# Patient Record
Sex: Female | Born: 1940 | Race: White | Hispanic: No | Smoking: Never smoker
Health system: Southern US, Community
[De-identification: ages and names within clinical notes are randomized; demographics above are authoritative.]

## PROBLEM LIST (undated history)

## (undated) DIAGNOSIS — E785 Hyperlipidemia, unspecified: Secondary | ICD-10-CM

## (undated) DIAGNOSIS — I129 Hypertensive chronic kidney disease with stage 1 through stage 4 chronic kidney disease, or unspecified chronic kidney disease: Secondary | ICD-10-CM

## (undated) DIAGNOSIS — M179 Osteoarthritis of knee, unspecified: Secondary | ICD-10-CM

## (undated) DIAGNOSIS — E1165 Type 2 diabetes mellitus with hyperglycemia: Secondary | ICD-10-CM

## (undated) DIAGNOSIS — M1710 Unilateral primary osteoarthritis, unspecified knee: Secondary | ICD-10-CM

## (undated) DIAGNOSIS — E119 Type 2 diabetes mellitus without complications: Secondary | ICD-10-CM

## (undated) DIAGNOSIS — I10 Essential (primary) hypertension: Secondary | ICD-10-CM

## (undated) DIAGNOSIS — N182 Chronic kidney disease, stage 2 (mild): Secondary | ICD-10-CM

## (undated) DIAGNOSIS — E1129 Type 2 diabetes mellitus with other diabetic kidney complication: Secondary | ICD-10-CM

## (undated) DIAGNOSIS — E669 Obesity, unspecified: Secondary | ICD-10-CM

## (undated) DIAGNOSIS — M1990 Unspecified osteoarthritis, unspecified site: Secondary | ICD-10-CM

## (undated) HISTORY — DX: Type 2 diabetes mellitus with other diabetic kidney complication: E11.29

## (undated) HISTORY — DX: Essential (primary) hypertension: I10

## (undated) HISTORY — DX: Unspecified osteoarthritis, unspecified site: M19.90

## (undated) HISTORY — DX: Type 2 diabetes mellitus with hyperglycemia: E11.65

## (undated) HISTORY — DX: Osteoarthritis of knee, unspecified: M17.9

## (undated) HISTORY — DX: Hypertensive chronic kidney disease with stage 1 through stage 4 chronic kidney disease, or unspecified chronic kidney disease: I12.9

## (undated) HISTORY — DX: Type 2 diabetes mellitus without complications: E11.9

## (undated) HISTORY — DX: Obesity, unspecified: E66.9

## (undated) HISTORY — DX: Chronic kidney disease, stage 2 (mild): N18.2

## (undated) HISTORY — DX: Unilateral primary osteoarthritis, unspecified knee: M17.10

## (undated) HISTORY — DX: Hyperlipidemia, unspecified: E78.5

---

## 2008-02-20 ENCOUNTER — Ambulatory Visit (HOSPITAL_COMMUNITY): Admission: RE | Admit: 2008-02-20 | Discharge: 2008-02-20 | Payer: Self-pay | Admitting: Pulmonary Disease

## 2008-05-22 ENCOUNTER — Ambulatory Visit (HOSPITAL_COMMUNITY): Admission: RE | Admit: 2008-05-22 | Discharge: 2008-05-22 | Payer: Self-pay | Admitting: Pulmonary Disease

## 2008-05-22 ENCOUNTER — Encounter: Payer: Self-pay | Admitting: Orthopedic Surgery

## 2008-06-04 ENCOUNTER — Ambulatory Visit: Payer: Self-pay | Admitting: Orthopedic Surgery

## 2008-06-04 DIAGNOSIS — M1710 Unilateral primary osteoarthritis, unspecified knee: Secondary | ICD-10-CM

## 2008-06-04 DIAGNOSIS — IMO0002 Reserved for concepts with insufficient information to code with codable children: Secondary | ICD-10-CM | POA: Insufficient documentation

## 2008-08-25 ENCOUNTER — Ambulatory Visit (HOSPITAL_COMMUNITY): Admission: RE | Admit: 2008-08-25 | Discharge: 2008-08-25 | Payer: Self-pay | Admitting: Pulmonary Disease

## 2009-03-23 ENCOUNTER — Ambulatory Visit (HOSPITAL_COMMUNITY): Admission: RE | Admit: 2009-03-23 | Discharge: 2009-03-23 | Payer: Self-pay | Admitting: Pulmonary Disease

## 2009-05-11 ENCOUNTER — Ambulatory Visit (HOSPITAL_COMMUNITY): Admission: RE | Admit: 2009-05-11 | Discharge: 2009-05-11 | Payer: Self-pay | Admitting: Pulmonary Disease

## 2009-09-02 ENCOUNTER — Encounter: Payer: Self-pay | Admitting: Orthopedic Surgery

## 2010-08-17 IMAGING — CR DG CERVICAL SPINE COMPLETE 4+V
6 series · 6 of 6 positions shown · non-contrast
Comparison: None available

CLINICAL DATA: Bilateral hand numbness

CERVICAL SPINE - COMPLETE 4+ VIEW

[view not recorded (1 of 6)]
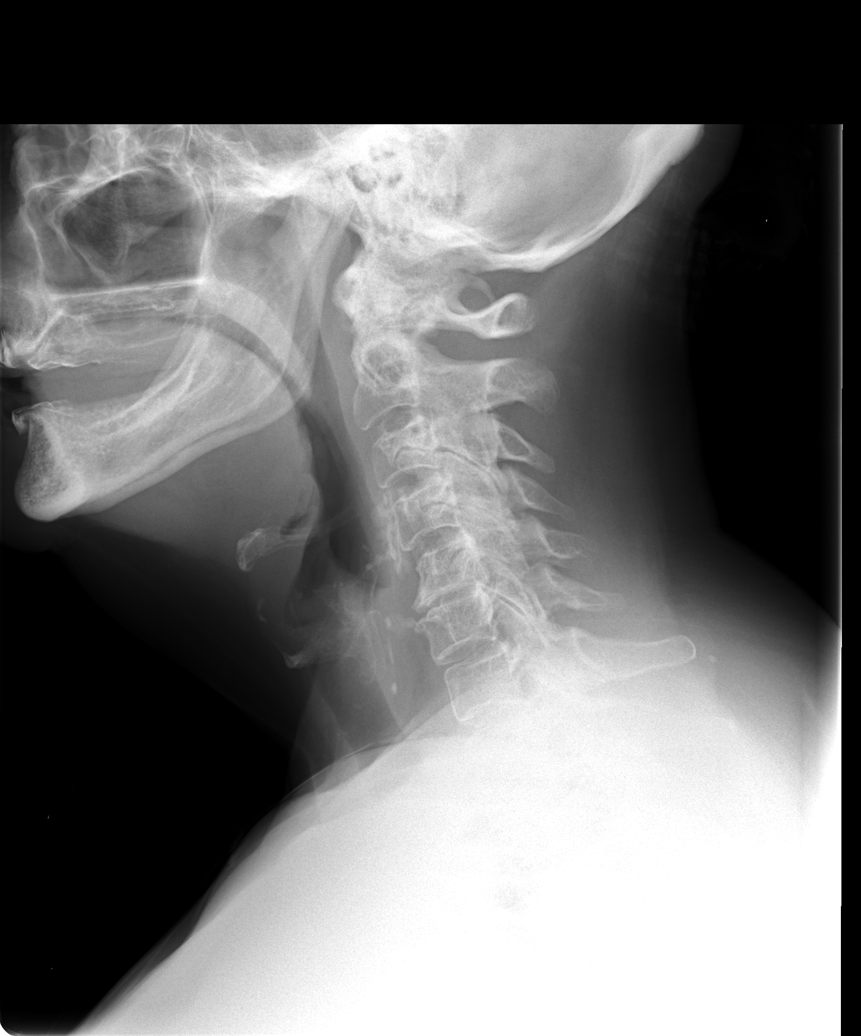

[view not recorded (2 of 6)]
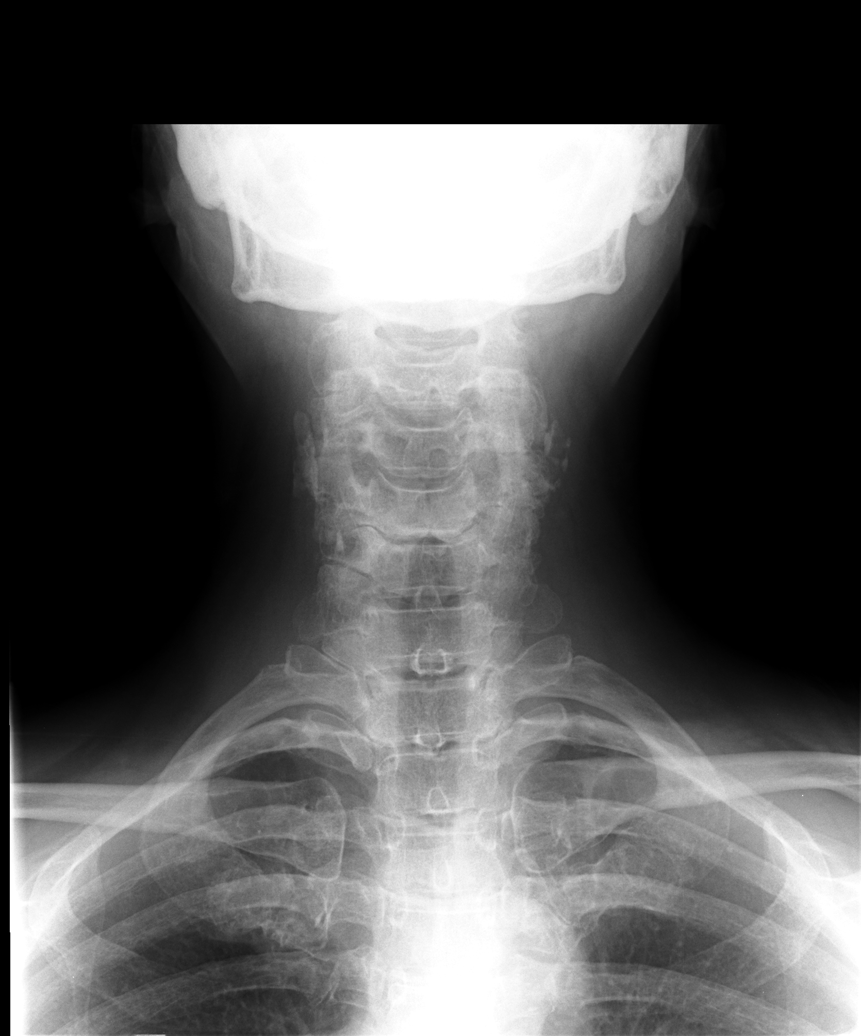

[view not recorded (3 of 6)]
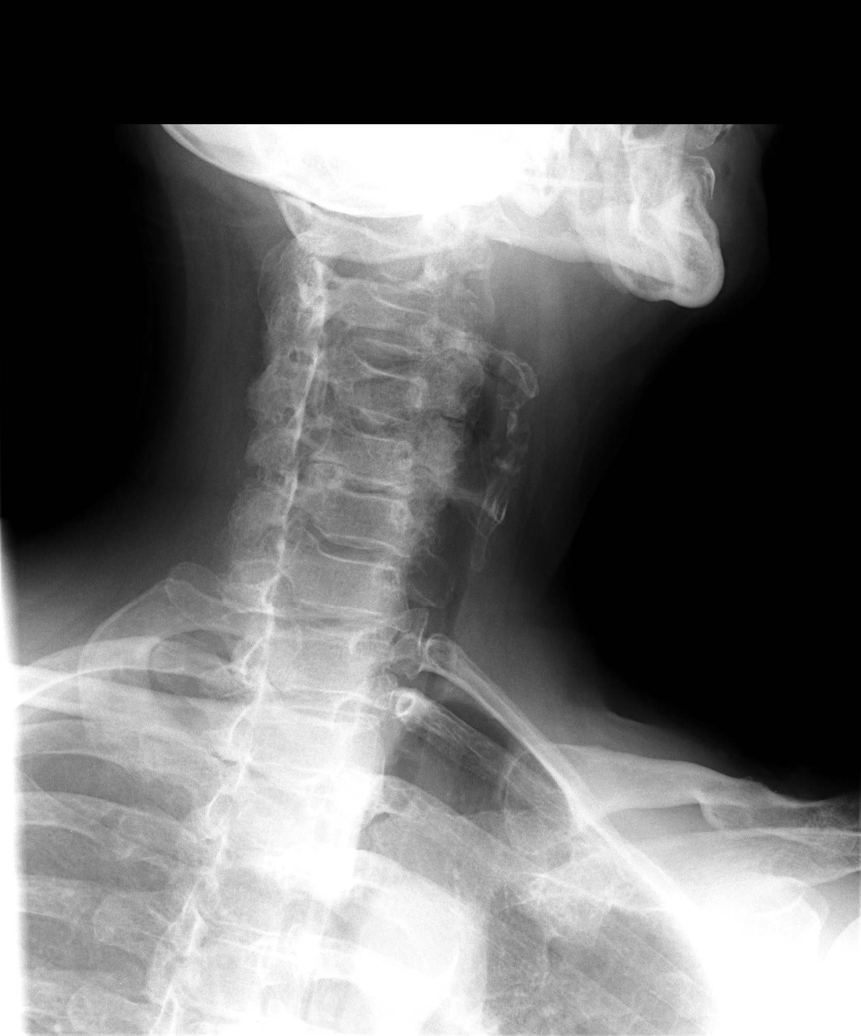

[view not recorded (4 of 6)]
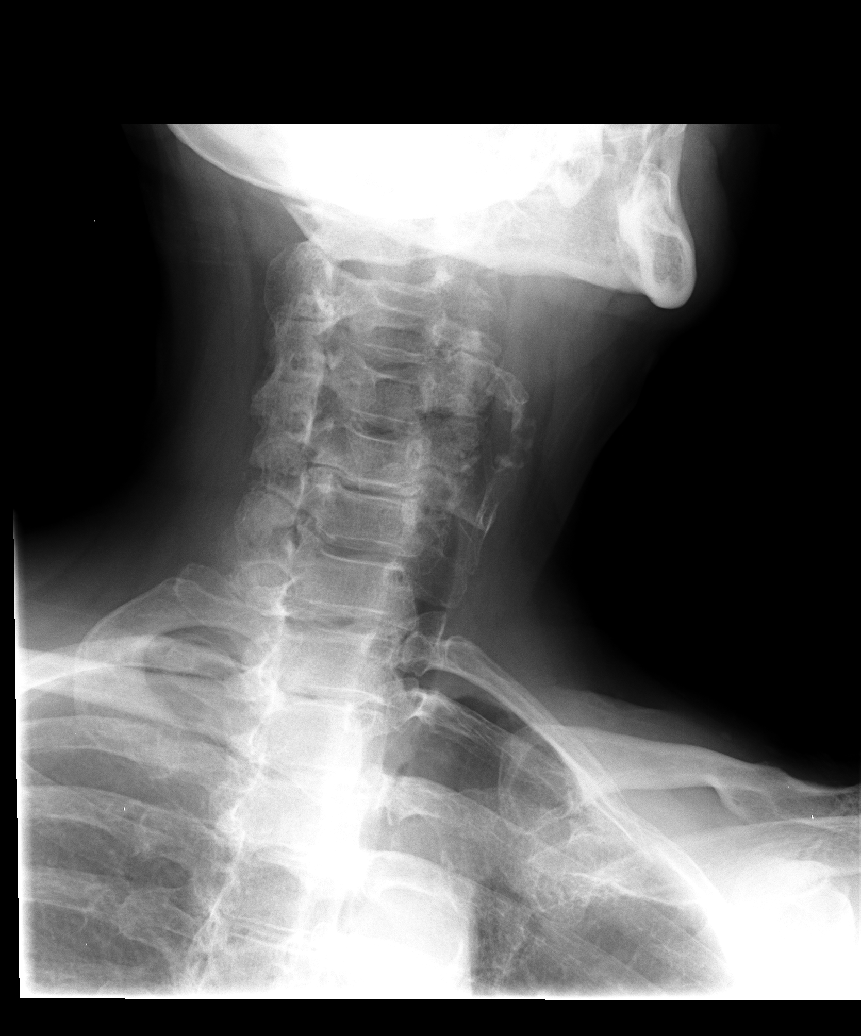

[view not recorded (5 of 6)]
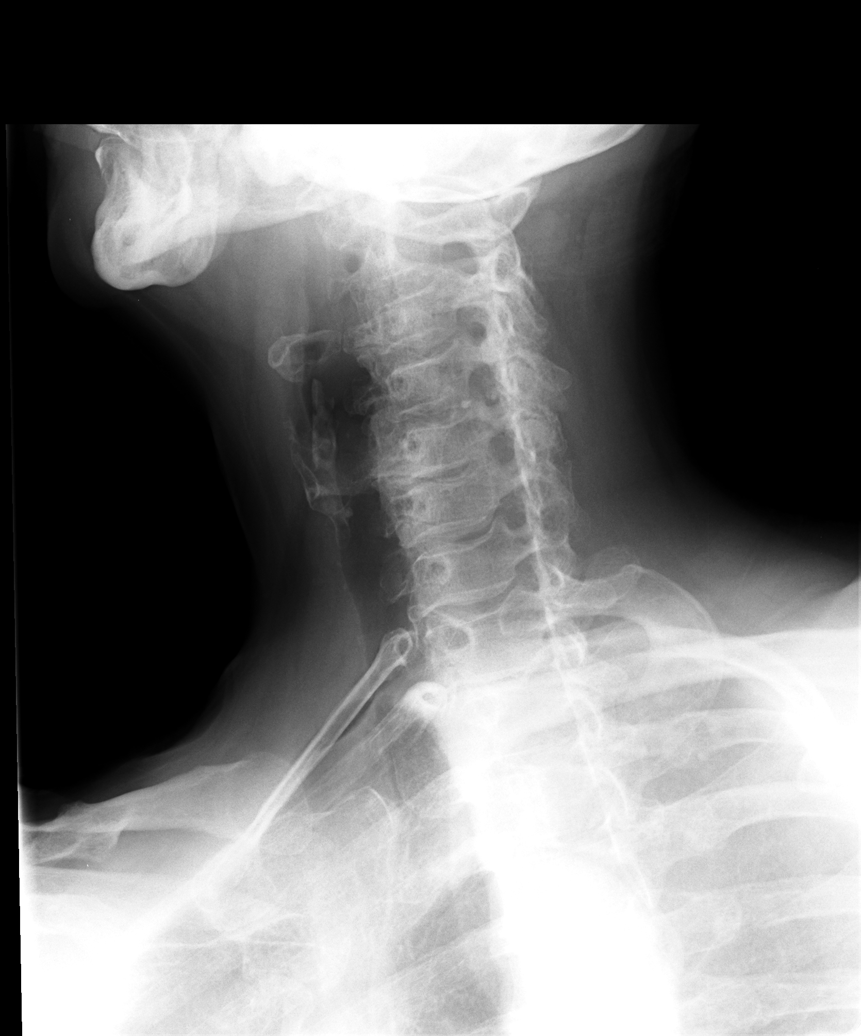

[view not recorded (6 of 6)]
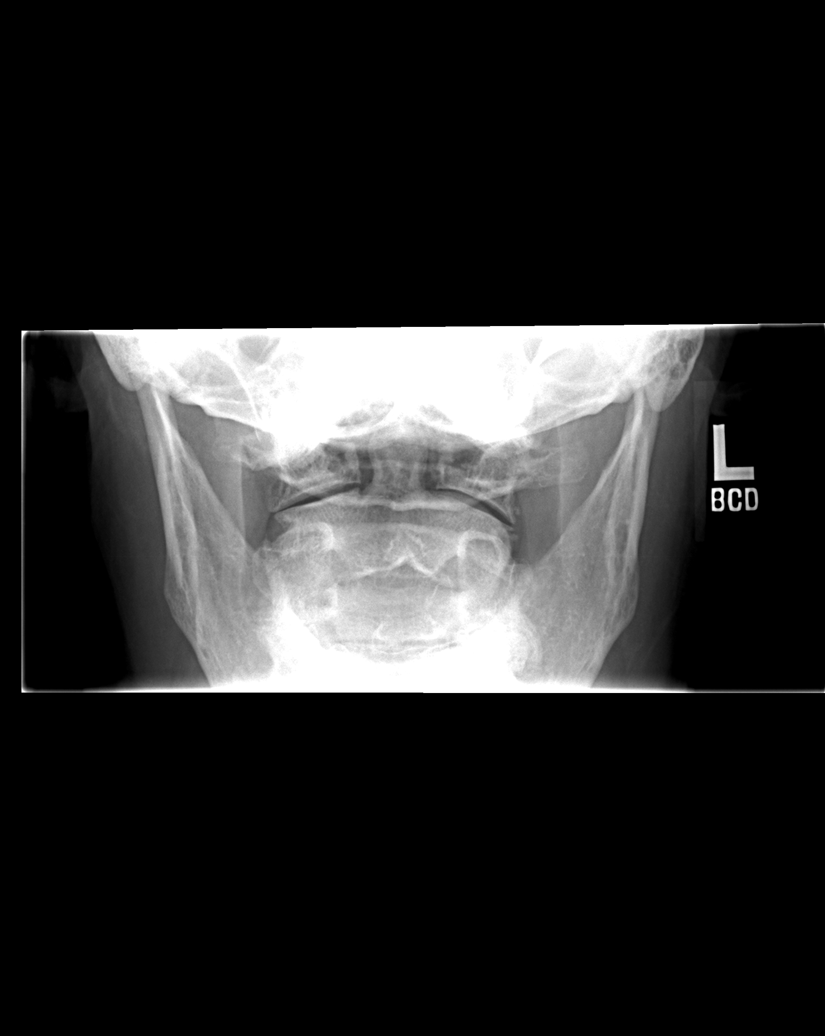

[6 of 6 positions shown; findings below may reference images not displayed]

FINDINGS: Negative for fracture.  There is straightening of the
normal lordosis of the cervical spine.  Narrowing of the C5-6
interspace with associated endplate spurring.  There is asymmetric
uncovertebral encroachment on the C5-6 neural foramen, right worse
than left.  There is bilateral facet degenerative hypertrophy C4-5
and C5-6.  Bilateral carotid bifurcation calcified plaque is noted.
The patient is edentulous.
IMPRESSION: 1. Negative for fracture or other acute bone injury.
2. Loss of the normal cervical spine lordosis, which may be
secondary to positioning, spasm, or soft tissue injury.
3.  Degenerative changes most marked C4-C6 as enumerated above.

## 2010-08-30 ENCOUNTER — Ambulatory Visit (HOSPITAL_COMMUNITY): Admission: RE | Admit: 2010-08-30 | Discharge: 2010-08-30 | Payer: Self-pay | Admitting: Pulmonary Disease

## 2010-11-02 NOTE — Letter (Signed)
Summary: Medical Record request [employer]  Medical Record request [employer]   Imported By: Cammie Sickle 10/29/2009 09:06:06  _____________________________________________________________________  External Attachment:    Type:   Image     Comment:   External Document

## 2011-05-03 IMAGING — US US EXTREM LOW VENOUS BILAT
1 series · 14 of 24 positions shown · non-contrast
Comparison: None

CLINICAL DATA: Bilateral leg and feet swelling

VENOUS DUPLEX ULTRASOUND OF BILATERAL LOWER EXTREMITIES
TECHNIQUE: Gray-scale sonography with graded compression, as well
as color Doppler and duplex ultrasound, were performed to evaluate
the deep venous system of both lower extremities from the level of
the common femoral vein through the popliteal and proximal calf
veins. Spectral Doppler was utilized to evaluate flow at rest and
with distal augmentation maneuvers.

[Series 1: us extrem low venous bilat · 14 of 80 slices shown]
[im 1/80]
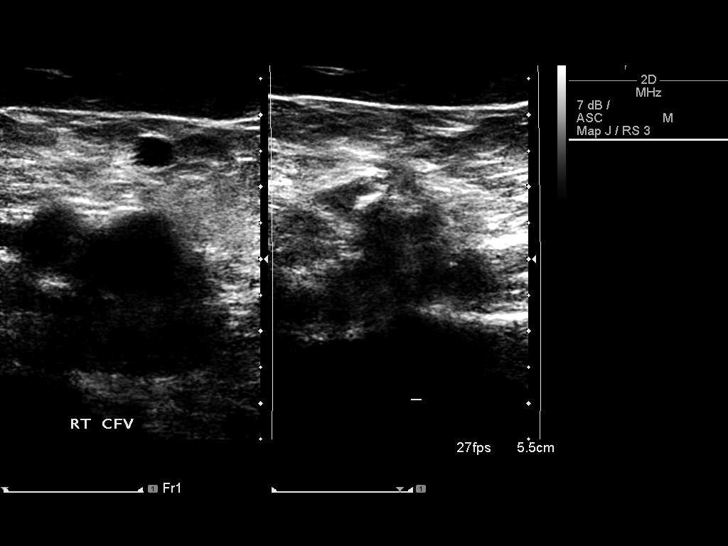
[im 7/80]
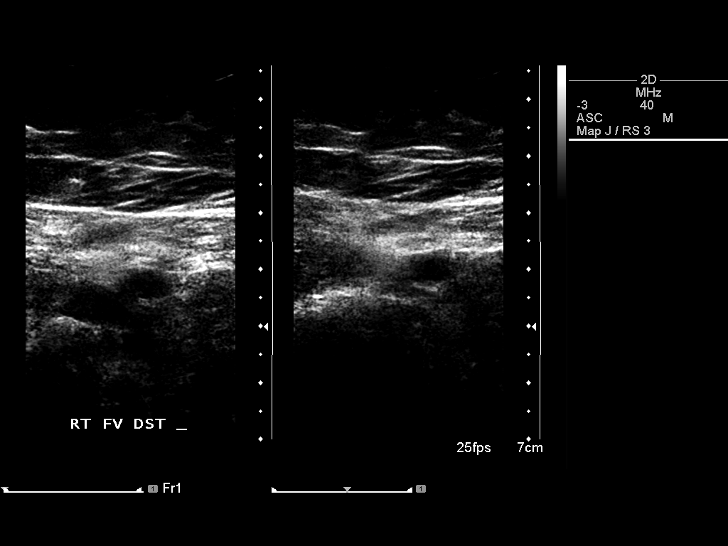
[im 14/80]
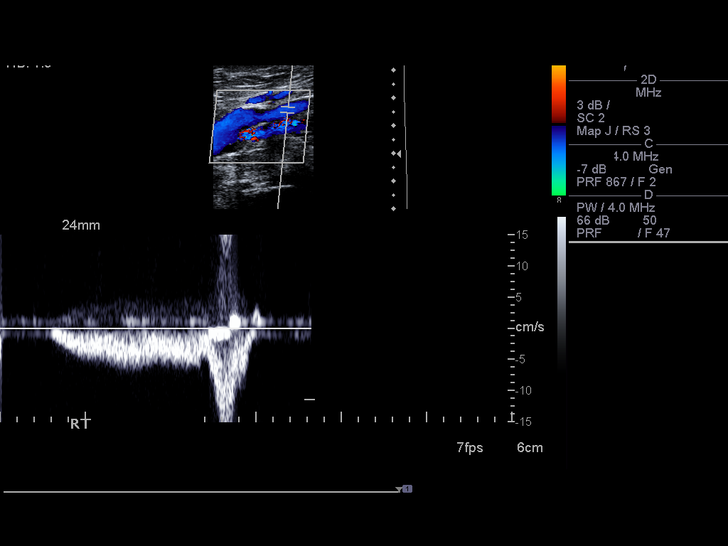
[im 21/80]
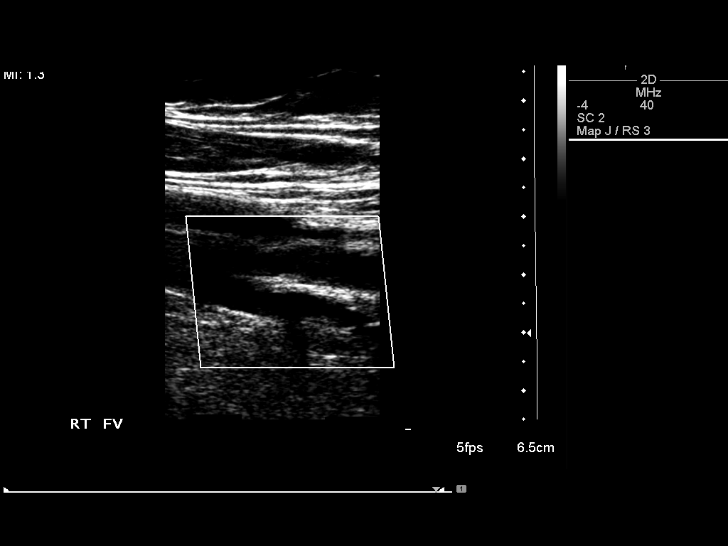
[im 25/80]
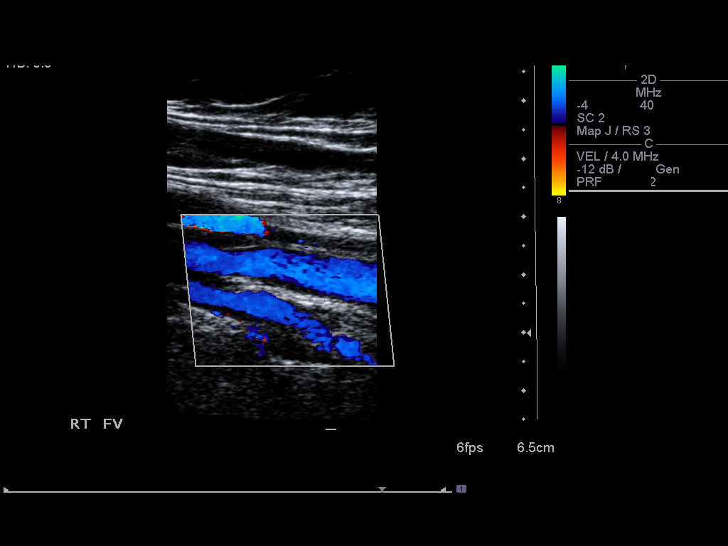
[im 31/80]
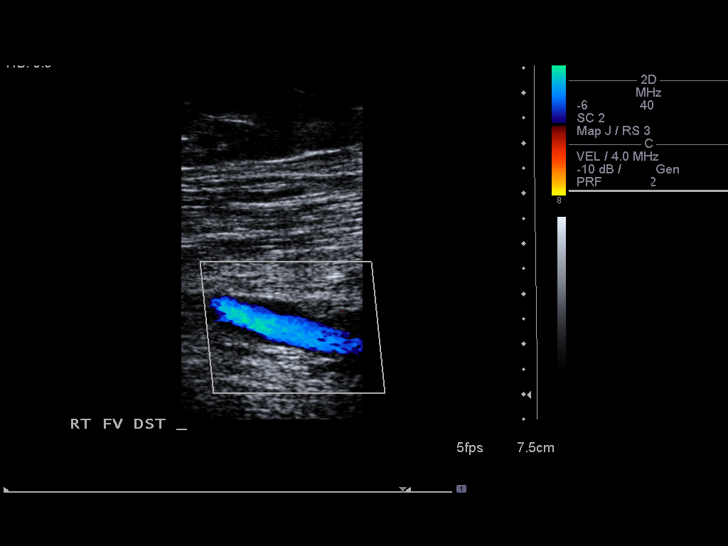
[im 38/80]
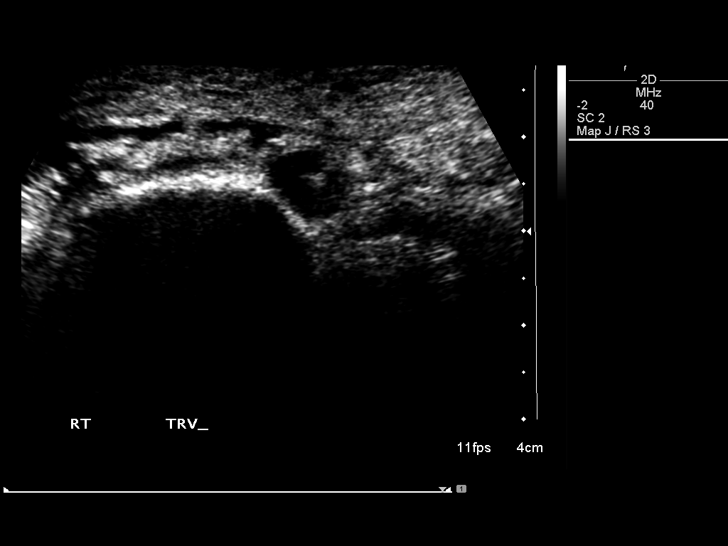
[im 42/80]
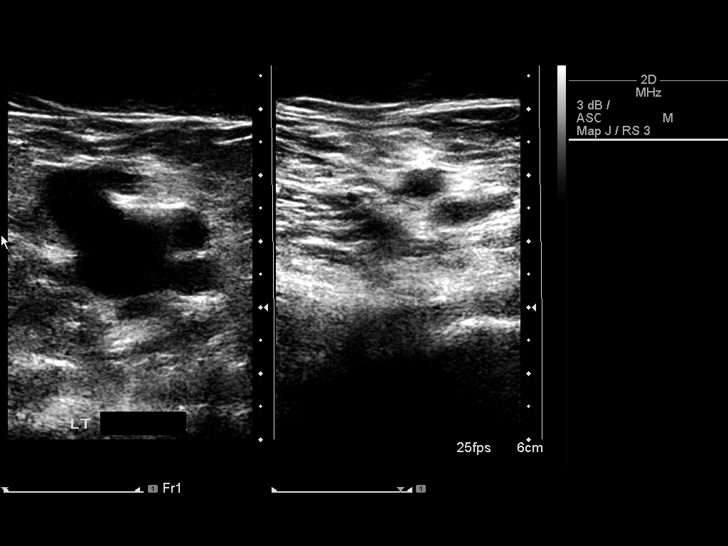
[im 49/80]
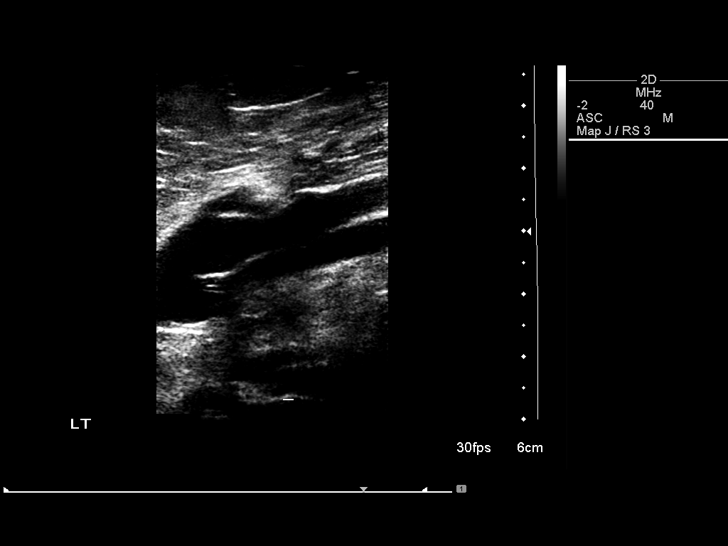
[im 55/80]
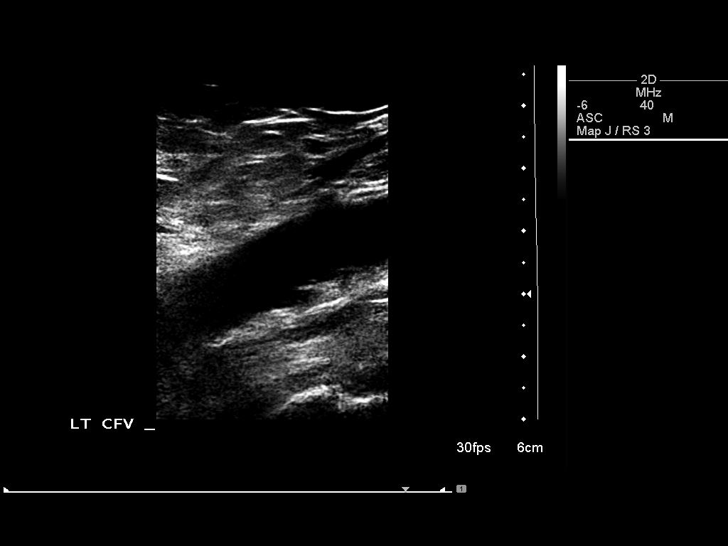
[im 62/80]
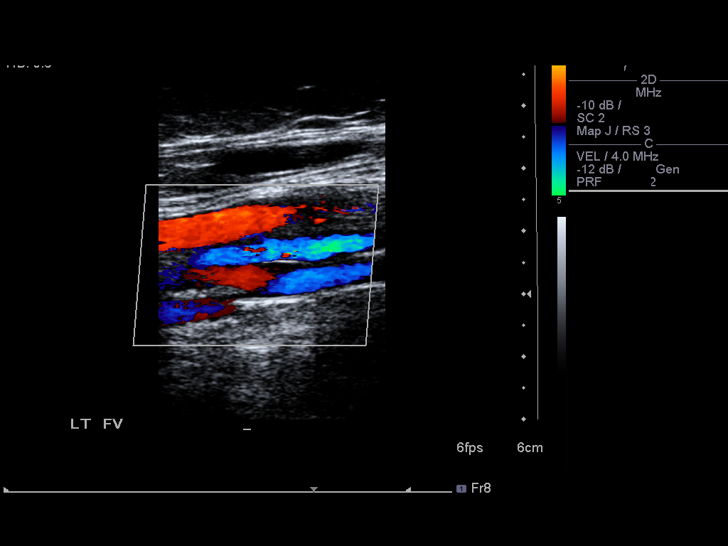
[im 66/80]
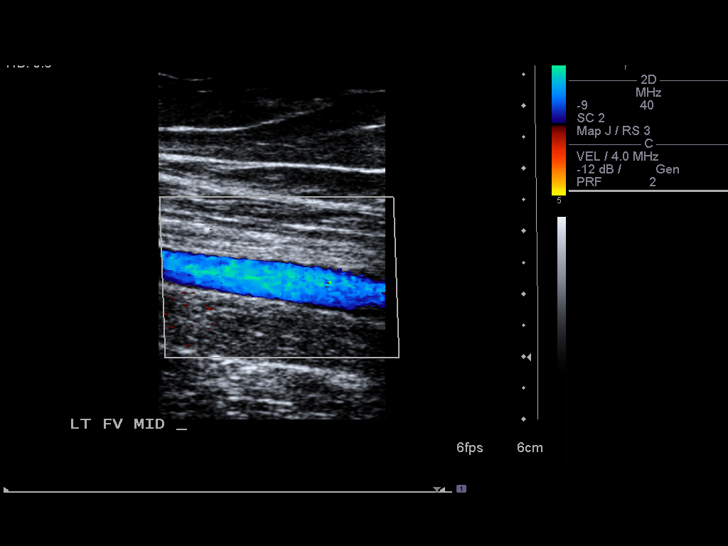
[im 73/80]
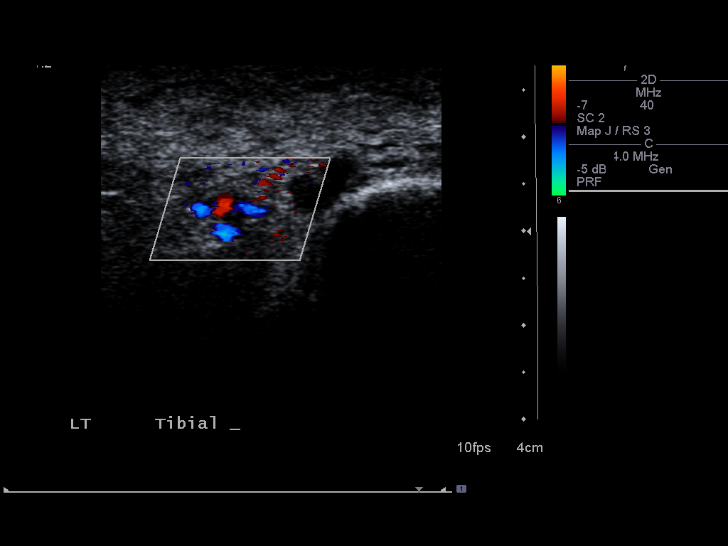
[im 80/80]
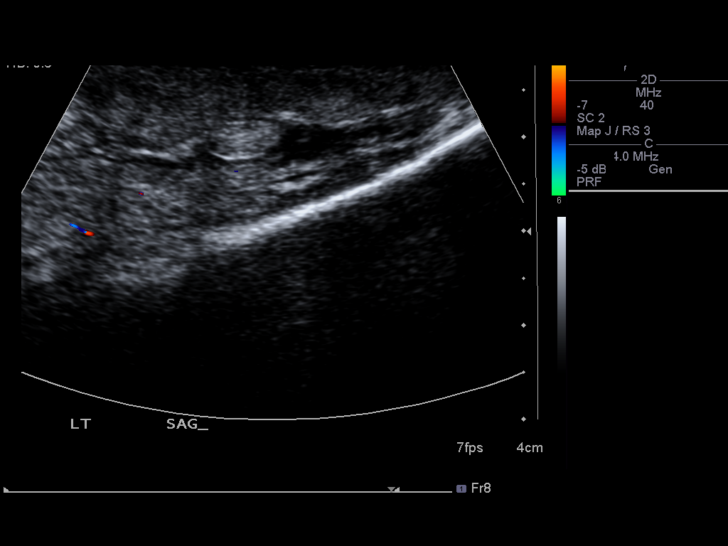

[14 of 24 positions shown; findings below may reference images not displayed]

FINDINGS: Deep venous systems appear patent and compressible from groin to
popliteal fossa bilaterally.
Spontaneous venous flow present with intact augmentation and
evidence of respiratory phasicity.
No intraluminal thrombus identified.
Scattered subcutaneous edema in the lower extremities noted.
Visualized portions of greater saphenous systems appear normal.
IMPRESSION: No evidence of deep venous thrombosis in the lower extremities.

## 2011-09-28 ENCOUNTER — Other Ambulatory Visit (HOSPITAL_COMMUNITY): Payer: Self-pay | Admitting: Pulmonary Disease

## 2011-09-28 DIAGNOSIS — Z139 Encounter for screening, unspecified: Secondary | ICD-10-CM

## 2011-10-06 ENCOUNTER — Ambulatory Visit (HOSPITAL_COMMUNITY)
Admission: RE | Admit: 2011-10-06 | Discharge: 2011-10-06 | Disposition: A | Discharge status: Home / Self Care | Payer: Medicare Other | Source: Ambulatory Visit | Attending: Pulmonary Disease | Admitting: Pulmonary Disease

## 2011-10-06 DIAGNOSIS — Z1231 Encounter for screening mammogram for malignant neoplasm of breast: Secondary | ICD-10-CM | POA: Insufficient documentation

## 2011-10-06 DIAGNOSIS — Z139 Encounter for screening, unspecified: Secondary | ICD-10-CM

## 2012-11-23 ENCOUNTER — Other Ambulatory Visit (HOSPITAL_COMMUNITY): Payer: Self-pay | Admitting: Pulmonary Disease

## 2012-11-23 DIAGNOSIS — Z139 Encounter for screening, unspecified: Secondary | ICD-10-CM

## 2012-11-26 ENCOUNTER — Ambulatory Visit (HOSPITAL_COMMUNITY)
Admission: RE | Admit: 2012-11-26 | Discharge: 2012-11-26 | Disposition: A | Discharge status: Home / Self Care | Payer: Medicare Other | Source: Ambulatory Visit | Attending: Pulmonary Disease | Admitting: Pulmonary Disease

## 2012-11-26 DIAGNOSIS — Z1231 Encounter for screening mammogram for malignant neoplasm of breast: Secondary | ICD-10-CM | POA: Insufficient documentation

## 2012-11-26 DIAGNOSIS — Z139 Encounter for screening, unspecified: Secondary | ICD-10-CM

## 2014-01-01 ENCOUNTER — Other Ambulatory Visit (HOSPITAL_COMMUNITY): Payer: Self-pay | Admitting: Pulmonary Disease

## 2014-01-01 DIAGNOSIS — Z139 Encounter for screening, unspecified: Secondary | ICD-10-CM

## 2014-01-02 ENCOUNTER — Ambulatory Visit (HOSPITAL_COMMUNITY)
Admission: RE | Admit: 2014-01-02 | Discharge: 2014-01-02 | Disposition: A | Discharge status: Home / Self Care | Payer: Medicare Other | Source: Ambulatory Visit | Attending: Pulmonary Disease | Admitting: Pulmonary Disease

## 2014-01-02 DIAGNOSIS — Z139 Encounter for screening, unspecified: Secondary | ICD-10-CM

## 2014-01-02 DIAGNOSIS — Z1231 Encounter for screening mammogram for malignant neoplasm of breast: Secondary | ICD-10-CM | POA: Insufficient documentation

## 2014-01-03 ENCOUNTER — Other Ambulatory Visit: Payer: Self-pay | Admitting: Pulmonary Disease

## 2014-01-03 DIAGNOSIS — R928 Other abnormal and inconclusive findings on diagnostic imaging of breast: Secondary | ICD-10-CM

## 2014-01-22 ENCOUNTER — Ambulatory Visit (HOSPITAL_COMMUNITY)
Admission: RE | Admit: 2014-01-22 | Discharge: 2014-01-22 | Disposition: A | Discharge status: Home / Self Care | Payer: Medicare Other | Source: Ambulatory Visit | Attending: Pulmonary Disease | Admitting: Pulmonary Disease

## 2014-01-22 DIAGNOSIS — R928 Other abnormal and inconclusive findings on diagnostic imaging of breast: Secondary | ICD-10-CM

## 2015-02-05 ENCOUNTER — Other Ambulatory Visit (HOSPITAL_COMMUNITY): Payer: Self-pay | Admitting: Pulmonary Disease

## 2015-02-05 DIAGNOSIS — Z1231 Encounter for screening mammogram for malignant neoplasm of breast: Secondary | ICD-10-CM

## 2015-02-24 ENCOUNTER — Ambulatory Visit (HOSPITAL_COMMUNITY)
Admission: RE | Admit: 2015-02-24 | Discharge: 2015-02-24 | Disposition: A | Discharge status: Home / Self Care | Payer: Medicare Other | Source: Ambulatory Visit | Attending: Pulmonary Disease | Admitting: Pulmonary Disease

## 2015-02-24 ENCOUNTER — Other Ambulatory Visit (HOSPITAL_COMMUNITY): Payer: Self-pay | Admitting: Pulmonary Disease

## 2015-02-24 DIAGNOSIS — Z1231 Encounter for screening mammogram for malignant neoplasm of breast: Secondary | ICD-10-CM | POA: Diagnosis present

## 2015-02-24 LAB — HM MAMMOGRAPHY

## 2015-02-26 ENCOUNTER — Ambulatory Visit (HOSPITAL_COMMUNITY): Payer: Self-pay

## 2015-02-26 ENCOUNTER — Other Ambulatory Visit: Payer: Self-pay | Admitting: Pulmonary Disease

## 2015-02-26 DIAGNOSIS — R928 Other abnormal and inconclusive findings on diagnostic imaging of breast: Secondary | ICD-10-CM

## 2015-03-17 ENCOUNTER — Ambulatory Visit (HOSPITAL_COMMUNITY)
Admission: RE | Admit: 2015-03-17 | Discharge: 2015-03-17 | Disposition: A | Discharge status: Home / Self Care | Payer: Medicare Other | Source: Ambulatory Visit | Attending: Pulmonary Disease | Admitting: Pulmonary Disease

## 2015-03-17 ENCOUNTER — Other Ambulatory Visit: Payer: Self-pay | Admitting: Pulmonary Disease

## 2015-03-17 DIAGNOSIS — R928 Other abnormal and inconclusive findings on diagnostic imaging of breast: Secondary | ICD-10-CM

## 2016-05-09 DIAGNOSIS — E119 Type 2 diabetes mellitus without complications: Secondary | ICD-10-CM | POA: Diagnosis not present

## 2016-05-09 DIAGNOSIS — M1990 Unspecified osteoarthritis, unspecified site: Secondary | ICD-10-CM | POA: Diagnosis not present

## 2016-05-09 DIAGNOSIS — I10 Essential (primary) hypertension: Secondary | ICD-10-CM | POA: Diagnosis not present

## 2016-05-09 DIAGNOSIS — E785 Hyperlipidemia, unspecified: Secondary | ICD-10-CM | POA: Diagnosis not present

## 2016-05-12 DIAGNOSIS — M25561 Pain in right knee: Secondary | ICD-10-CM | POA: Diagnosis not present

## 2016-05-12 DIAGNOSIS — E1129 Type 2 diabetes mellitus with other diabetic kidney complication: Secondary | ICD-10-CM | POA: Diagnosis not present

## 2016-05-12 DIAGNOSIS — I10 Essential (primary) hypertension: Secondary | ICD-10-CM | POA: Diagnosis not present

## 2016-05-12 DIAGNOSIS — N182 Chronic kidney disease, stage 2 (mild): Secondary | ICD-10-CM | POA: Diagnosis not present

## 2016-05-12 DIAGNOSIS — M25562 Pain in left knee: Secondary | ICD-10-CM | POA: Diagnosis not present

## 2016-05-12 DIAGNOSIS — M1990 Unspecified osteoarthritis, unspecified site: Secondary | ICD-10-CM | POA: Diagnosis not present

## 2016-08-17 DIAGNOSIS — Z23 Encounter for immunization: Secondary | ICD-10-CM | POA: Diagnosis not present

## 2016-09-08 DIAGNOSIS — E1165 Type 2 diabetes mellitus with hyperglycemia: Secondary | ICD-10-CM | POA: Diagnosis not present

## 2016-09-12 DIAGNOSIS — E1129 Type 2 diabetes mellitus with other diabetic kidney complication: Secondary | ICD-10-CM | POA: Diagnosis not present

## 2016-09-12 DIAGNOSIS — I10 Essential (primary) hypertension: Secondary | ICD-10-CM | POA: Diagnosis not present

## 2016-09-12 DIAGNOSIS — N182 Chronic kidney disease, stage 2 (mild): Secondary | ICD-10-CM | POA: Diagnosis not present

## 2016-12-07 DIAGNOSIS — E1165 Type 2 diabetes mellitus with hyperglycemia: Secondary | ICD-10-CM | POA: Diagnosis not present

## 2016-12-08 DIAGNOSIS — E785 Hyperlipidemia, unspecified: Secondary | ICD-10-CM | POA: Diagnosis not present

## 2016-12-08 DIAGNOSIS — E669 Obesity, unspecified: Secondary | ICD-10-CM | POA: Diagnosis not present

## 2016-12-08 DIAGNOSIS — E1165 Type 2 diabetes mellitus with hyperglycemia: Secondary | ICD-10-CM | POA: Diagnosis not present

## 2016-12-08 DIAGNOSIS — E1129 Type 2 diabetes mellitus with other diabetic kidney complication: Secondary | ICD-10-CM | POA: Diagnosis not present

## 2016-12-12 DIAGNOSIS — I10 Essential (primary) hypertension: Secondary | ICD-10-CM | POA: Diagnosis not present

## 2016-12-12 DIAGNOSIS — E1165 Type 2 diabetes mellitus with hyperglycemia: Secondary | ICD-10-CM | POA: Diagnosis not present

## 2016-12-12 DIAGNOSIS — E1129 Type 2 diabetes mellitus with other diabetic kidney complication: Secondary | ICD-10-CM | POA: Diagnosis not present

## 2016-12-12 DIAGNOSIS — E785 Hyperlipidemia, unspecified: Secondary | ICD-10-CM | POA: Diagnosis not present

## 2017-03-06 DIAGNOSIS — E1165 Type 2 diabetes mellitus with hyperglycemia: Secondary | ICD-10-CM | POA: Diagnosis not present

## 2017-03-08 IMAGING — US US BREAST LTD UNI LEFT INC AXILLA
1 series · 4 of 4 positions shown · non-contrast
Comparison: Prior exams

CLINICAL DATA: Call back from screening for a possible left breast
mass

EXAM:
DIGITAL DIAGNOSTIC LEFT MAMMOGRAM WITH 3D TOMOSYNTHESIS
ULTRASOUND LEFT BREAST

[Series 1: us breast ltd uni left inc axilla · 0.07mm/px · 4 of 4 slices shown]
[im 1/4]
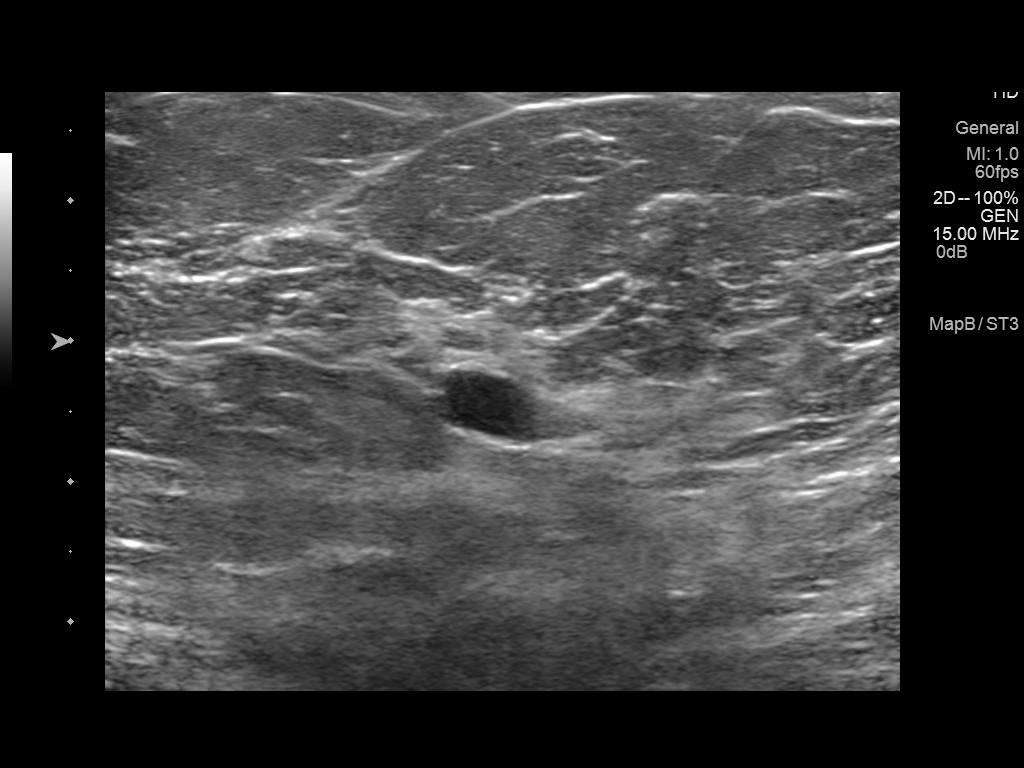
[im 2/4]
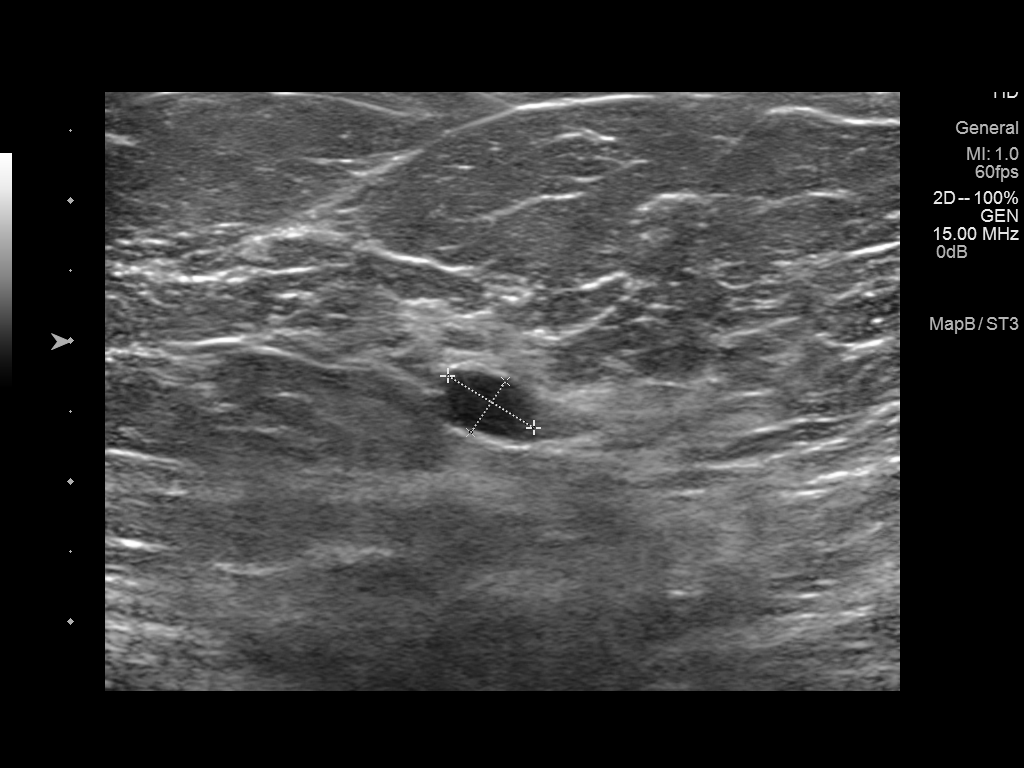
[im 3/4]
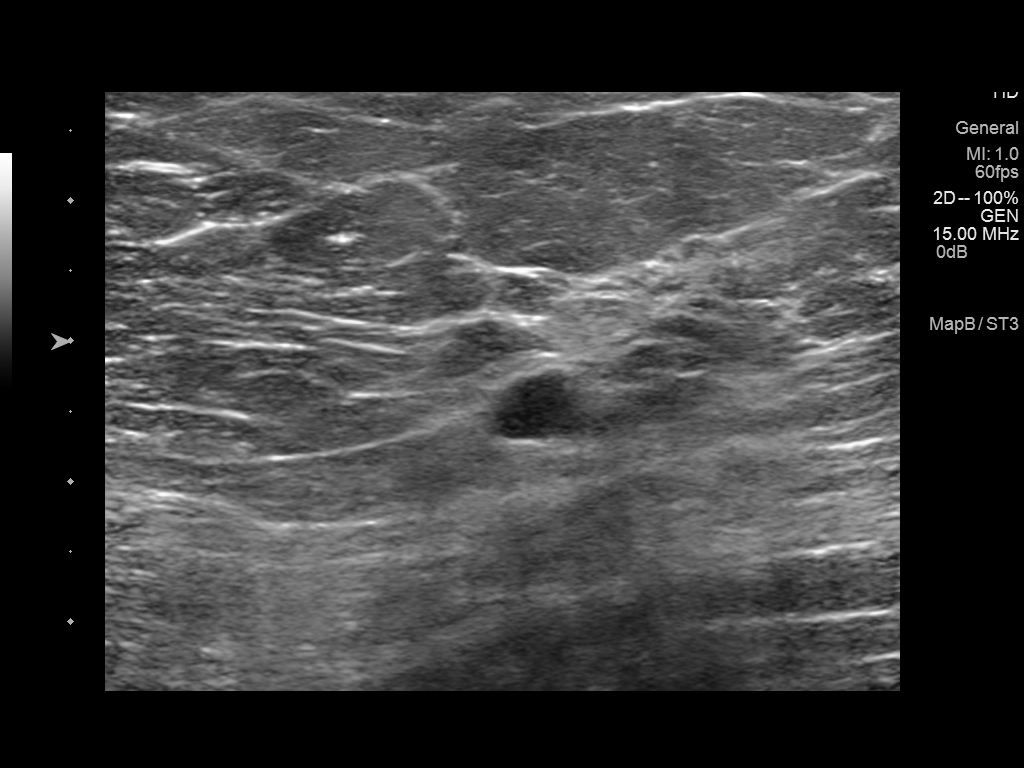
[im 4/4]
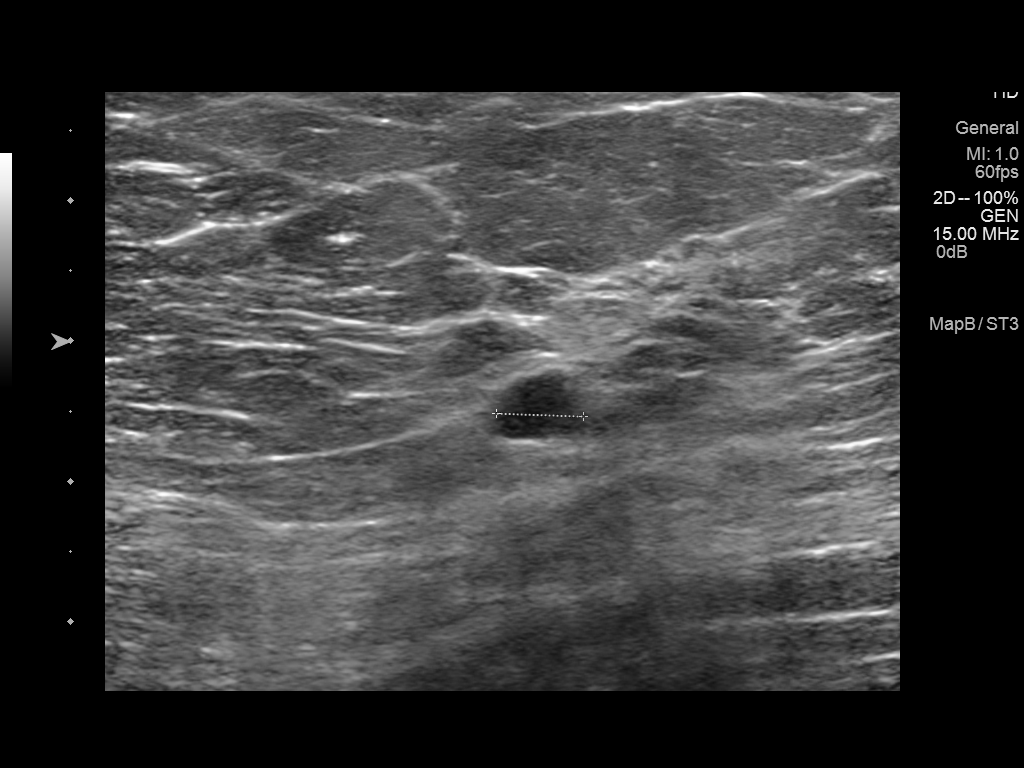

[4 of 4 positions shown; findings below may reference images not displayed]

ACR Breast Density Category b: There are scattered areas of
fibroglandular density.
FINDINGS: Possible mass persists on the spot compression tomosynthesis CC and
MLO views. It is oval in shape with circumscribed margins. It lies
just lateral to the nipple in the middle third depth of the left
breast.

Mammographic images were processed with CAD.

Targeted ultrasound is performed, showing a simple cyst in the upper
outer quadrant, 3 cm the nipple, measuring 7 mm x 4 mm x 6 mm. This
is consistent in size, shape and location to the mammographic
finding.
IMPRESSION: Benign left breast cyst.  No evidence of malignancy.

RECOMMENDATION:
Screening mammogram in one year.(Code:1I-[id number])

I have discussed the findings and recommendations with the patient.
Results were also provided in writing at the conclusion of the
visit. If applicable, a reminder letter will be sent to the patient
regarding the next appointment.

BI-RADS CATEGORY  2: Benign.

## 2017-03-20 DIAGNOSIS — N182 Chronic kidney disease, stage 2 (mild): Secondary | ICD-10-CM | POA: Diagnosis not present

## 2017-03-20 DIAGNOSIS — M1990 Unspecified osteoarthritis, unspecified site: Secondary | ICD-10-CM | POA: Diagnosis not present

## 2017-03-20 DIAGNOSIS — I10 Essential (primary) hypertension: Secondary | ICD-10-CM | POA: Diagnosis not present

## 2017-03-20 DIAGNOSIS — K210 Gastro-esophageal reflux disease with esophagitis: Secondary | ICD-10-CM | POA: Diagnosis not present

## 2017-05-10 DIAGNOSIS — E119 Type 2 diabetes mellitus without complications: Secondary | ICD-10-CM | POA: Diagnosis not present

## 2017-06-21 DIAGNOSIS — E1165 Type 2 diabetes mellitus with hyperglycemia: Secondary | ICD-10-CM | POA: Diagnosis not present

## 2017-06-21 DIAGNOSIS — E119 Type 2 diabetes mellitus without complications: Secondary | ICD-10-CM | POA: Diagnosis not present

## 2017-06-21 DIAGNOSIS — E785 Hyperlipidemia, unspecified: Secondary | ICD-10-CM | POA: Diagnosis not present

## 2017-06-21 DIAGNOSIS — M25561 Pain in right knee: Secondary | ICD-10-CM | POA: Diagnosis not present

## 2017-06-21 DIAGNOSIS — I10 Essential (primary) hypertension: Secondary | ICD-10-CM | POA: Diagnosis not present

## 2017-06-21 DIAGNOSIS — E1129 Type 2 diabetes mellitus with other diabetic kidney complication: Secondary | ICD-10-CM | POA: Diagnosis not present

## 2017-07-17 DIAGNOSIS — E119 Type 2 diabetes mellitus without complications: Secondary | ICD-10-CM | POA: Diagnosis not present

## 2017-09-21 DIAGNOSIS — Z0000 Encounter for general adult medical examination without abnormal findings: Secondary | ICD-10-CM | POA: Diagnosis not present

## 2017-12-15 DIAGNOSIS — E1165 Type 2 diabetes mellitus with hyperglycemia: Secondary | ICD-10-CM | POA: Diagnosis not present

## 2017-12-19 DIAGNOSIS — I129 Hypertensive chronic kidney disease with stage 1 through stage 4 chronic kidney disease, or unspecified chronic kidney disease: Secondary | ICD-10-CM | POA: Diagnosis not present

## 2017-12-19 DIAGNOSIS — M170 Bilateral primary osteoarthritis of knee: Secondary | ICD-10-CM | POA: Diagnosis not present

## 2017-12-19 DIAGNOSIS — E1129 Type 2 diabetes mellitus with other diabetic kidney complication: Secondary | ICD-10-CM | POA: Diagnosis not present

## 2017-12-19 DIAGNOSIS — N182 Chronic kidney disease, stage 2 (mild): Secondary | ICD-10-CM | POA: Diagnosis not present

## 2017-12-25 ENCOUNTER — Ambulatory Visit (INDEPENDENT_AMBULATORY_CARE_PROVIDER_SITE_OTHER): Payer: Medicare Other | Admitting: Otolaryngology

## 2017-12-25 DIAGNOSIS — H903 Sensorineural hearing loss, bilateral: Secondary | ICD-10-CM

## 2018-05-03 DIAGNOSIS — E1129 Type 2 diabetes mellitus with other diabetic kidney complication: Secondary | ICD-10-CM | POA: Diagnosis not present

## 2018-05-03 DIAGNOSIS — E119 Type 2 diabetes mellitus without complications: Secondary | ICD-10-CM | POA: Diagnosis not present

## 2018-06-15 DIAGNOSIS — I10 Essential (primary) hypertension: Secondary | ICD-10-CM | POA: Diagnosis not present

## 2018-06-15 DIAGNOSIS — N182 Chronic kidney disease, stage 2 (mild): Secondary | ICD-10-CM | POA: Diagnosis not present

## 2018-06-15 DIAGNOSIS — E1129 Type 2 diabetes mellitus with other diabetic kidney complication: Secondary | ICD-10-CM | POA: Diagnosis not present

## 2018-06-19 DIAGNOSIS — E785 Hyperlipidemia, unspecified: Secondary | ICD-10-CM | POA: Diagnosis not present

## 2018-06-19 DIAGNOSIS — N182 Chronic kidney disease, stage 2 (mild): Secondary | ICD-10-CM | POA: Diagnosis not present

## 2018-06-19 DIAGNOSIS — E1121 Type 2 diabetes mellitus with diabetic nephropathy: Secondary | ICD-10-CM | POA: Diagnosis not present

## 2018-06-19 DIAGNOSIS — I10 Essential (primary) hypertension: Secondary | ICD-10-CM | POA: Diagnosis not present

## 2018-09-18 DIAGNOSIS — Z23 Encounter for immunization: Secondary | ICD-10-CM | POA: Diagnosis not present

## 2018-09-18 DIAGNOSIS — Z0000 Encounter for general adult medical examination without abnormal findings: Secondary | ICD-10-CM | POA: Diagnosis not present

## 2018-09-18 LAB — HM DIABETES FOOT EXAM

## 2018-09-21 DIAGNOSIS — I10 Essential (primary) hypertension: Secondary | ICD-10-CM | POA: Diagnosis not present

## 2018-09-21 DIAGNOSIS — E1165 Type 2 diabetes mellitus with hyperglycemia: Secondary | ICD-10-CM | POA: Diagnosis not present

## 2018-09-21 DIAGNOSIS — Z0000 Encounter for general adult medical examination without abnormal findings: Secondary | ICD-10-CM | POA: Diagnosis not present

## 2018-09-21 DIAGNOSIS — E1129 Type 2 diabetes mellitus with other diabetic kidney complication: Secondary | ICD-10-CM | POA: Diagnosis not present

## 2018-12-17 DIAGNOSIS — E1165 Type 2 diabetes mellitus with hyperglycemia: Secondary | ICD-10-CM | POA: Diagnosis not present

## 2018-12-18 DIAGNOSIS — E1169 Type 2 diabetes mellitus with other specified complication: Secondary | ICD-10-CM | POA: Diagnosis not present

## 2018-12-18 DIAGNOSIS — E785 Hyperlipidemia, unspecified: Secondary | ICD-10-CM | POA: Diagnosis not present

## 2018-12-18 DIAGNOSIS — M170 Bilateral primary osteoarthritis of knee: Secondary | ICD-10-CM | POA: Diagnosis not present

## 2018-12-18 DIAGNOSIS — I129 Hypertensive chronic kidney disease with stage 1 through stage 4 chronic kidney disease, or unspecified chronic kidney disease: Secondary | ICD-10-CM | POA: Diagnosis not present

## 2019-01-23 DIAGNOSIS — E119 Type 2 diabetes mellitus without complications: Secondary | ICD-10-CM | POA: Diagnosis not present

## 2019-03-21 DIAGNOSIS — G629 Polyneuropathy, unspecified: Secondary | ICD-10-CM | POA: Diagnosis not present

## 2019-03-21 DIAGNOSIS — I10 Essential (primary) hypertension: Secondary | ICD-10-CM | POA: Diagnosis not present

## 2019-03-21 DIAGNOSIS — M170 Bilateral primary osteoarthritis of knee: Secondary | ICD-10-CM | POA: Diagnosis not present

## 2019-03-21 DIAGNOSIS — E119 Type 2 diabetes mellitus without complications: Secondary | ICD-10-CM | POA: Diagnosis not present

## 2019-06-24 DIAGNOSIS — I10 Essential (primary) hypertension: Secondary | ICD-10-CM | POA: Diagnosis not present

## 2019-06-24 DIAGNOSIS — E785 Hyperlipidemia, unspecified: Secondary | ICD-10-CM | POA: Diagnosis not present

## 2019-06-24 DIAGNOSIS — E1165 Type 2 diabetes mellitus with hyperglycemia: Secondary | ICD-10-CM | POA: Diagnosis not present

## 2019-06-24 DIAGNOSIS — E1129 Type 2 diabetes mellitus with other diabetic kidney complication: Secondary | ICD-10-CM | POA: Diagnosis not present

## 2019-06-25 DIAGNOSIS — E785 Hyperlipidemia, unspecified: Secondary | ICD-10-CM | POA: Diagnosis not present

## 2019-06-25 DIAGNOSIS — E119 Type 2 diabetes mellitus without complications: Secondary | ICD-10-CM | POA: Diagnosis not present

## 2019-06-25 DIAGNOSIS — M170 Bilateral primary osteoarthritis of knee: Secondary | ICD-10-CM | POA: Diagnosis not present

## 2019-06-25 DIAGNOSIS — I10 Essential (primary) hypertension: Secondary | ICD-10-CM | POA: Diagnosis not present

## 2019-06-25 LAB — LIPID PANEL
Cholesterol: 222 — AB (ref 0–200)
HDL: 62 (ref 35–70)
LDL Cholesterol: 134
LDl/HDL Ratio: 3.6
Triglycerides: 133 (ref 40–160)

## 2019-06-25 LAB — BASIC METABOLIC PANEL
BUN: 10 (ref 4–21)
Chloride: 100 (ref 99–108)
Creatinine: 0.8 (ref <=1.1)
Glucose: 104
Potassium: 3.6 (ref 3.4–5.3)
Sodium: 142 (ref 137–147)

## 2019-06-25 LAB — HEMOGLOBIN A1C: Hemoglobin A1C: 6.2

## 2019-06-25 LAB — COMPREHENSIVE METABOLIC PANEL
Albumin: 4.5 (ref 3.5–5.0)
Calcium: 9.7 (ref 8.7–10.7)
GFR calc Af Amer: 87
GFR calc non Af Amer: 75
Globulin: 2.3

## 2019-06-25 LAB — CBC AND DIFFERENTIAL
HCT: 39 (ref 36–46)
Hemoglobin: 12.9 (ref 12.0–16.0)
Platelets: 227 (ref 150–399)
WBC: 4.7

## 2019-06-25 LAB — HEPATIC FUNCTION PANEL
ALT: 11 (ref 7–35)
AST: 16 (ref 13–35)
Alkaline Phosphatase: 63 (ref 25–125)
Bilirubin, Total: 0.6

## 2019-06-25 LAB — TSH: TSH: 0.96 (ref <=5.90)

## 2019-06-25 LAB — CBC: RBC: 4.52 (ref 3.87–5.11)

## 2019-07-09 ENCOUNTER — Other Ambulatory Visit: Payer: Self-pay

## 2019-07-09 ENCOUNTER — Ambulatory Visit
Admission: EM | Admit: 2019-07-09 | Discharge: 2019-07-09 | Disposition: A | Discharge status: Home / Self Care | Payer: Medicare Other

## 2019-07-09 DIAGNOSIS — L304 Erythema intertrigo: Secondary | ICD-10-CM | POA: Diagnosis not present

## 2019-07-09 HISTORY — DX: Type 2 diabetes mellitus without complications: E11.9

## 2019-07-09 HISTORY — DX: Essential (primary) hypertension: I10

## 2019-07-09 MED ORDER — CLOTRIMAZOLE 1 % EX OINT: 1.0000 "application " | TOPICAL_OINTMENT | Freq: Two times a day (BID) | CUTANEOUS | 0 refills | Status: DC

## 2019-07-09 NOTE — Discharge Instructions (Addendum)
Daily cleansing of skin folds with a mild cleanser followed by drying of affected area with a soft cloth or hair dryer on a cool setting Airing of affected area when feasible Daily application of drying powders Use of absorbent material or clothing, such as cotton or merino wool, to separate skin in folds Clotrimazole prescribed.  Use as instructed Follow up with PCP next week for recheck and to ensure your symptoms are improving Go to the ED if the patient has any new or worsening symptoms such as fever, chills, decreased appetite, decreased activity, drooling, vomiting, cough, rash, changes in bowel or bladder function, etc..Marland Kitchen

## 2019-07-09 NOTE — ED Triage Notes (Signed)
Pt has rash under panniculus of abdomen

## 2019-07-09 NOTE — ED Provider Notes (Signed)
[facility]   [id number] 07/09/19 Arrival Time: 1039  CC: Rash  SUBJECTIVE:  JAMESYN MOOREFIELD is a 78 y.o. female hx significant for DM, and HTN, who presents with a rash that began 10 days ago.  Denies precipitating event or trauma.  Denies changes in soaps, detergents, close contacts with similar rash, environmental trigger, or allergy.  Denies medications change or starting a new medication recently.  Localizes the rash to lower abdomen.  Describes it as itchy and red.  PCP called in valtrex, because patient was worried she had shingles.  Has tried valtrex with minimal relief.  Denies aggravating factors.  Denies similar symptoms in the past.   Denies fever, chills, nausea, vomiting, swelling, SOB, chest pain, abdominal pain, changes in bowel or bladder function.    ROS: As per HPI.  All other pertinent ROS negative.     Past Medical History:  Diagnosis Date  . Diabetes mellitus without complication ([place])   . Hypertension    History reviewed. No pertinent surgical history. No Known Allergies No current facility-administered medications on file prior to encounter.    Current Outpatient Medications on File Prior to Encounter  Medication Sig Dispense Refill  . hydrochlorothiazide (MICROZIDE) 12.5 MG capsule Take 12.5 mg by mouth daily.    . metFORMIN (GLUCOPHAGE) 500 MG tablet Take 500 mg by mouth daily.    . pravastatin (PRAVACHOL) 40 MG tablet Take 40 mg by mouth daily.    . valACYclovir (VALTREX) 1000 MG tablet Take 1,000 mg by mouth daily.     Social History   Socioeconomic History  . Marital status: Married    Spouse name: Not on file  . Number of children: Not on file  . Years of education: Not on file  . Highest education level: Not on file  Occupational History  . Not on file  Social Needs  . Financial resource strain: Not on file  . Food insecurity    Worry: Not on file    Inability: Not on file  . Transportation needs    Medical: Not on file   Non-medical: Not on file  Tobacco Use  . Smoking status: Never Smoker  . Smokeless tobacco: Never Used  Substance and Sexual Activity  . Alcohol use: Not Currently  . Drug use: Not Currently  . Sexual activity: Not on file  Lifestyle  . Physical activity    Days per week: Not on file    Minutes per session: Not on file  . Stress: Not on file  Relationships  . Social Herbalist on phone: Not on file    Gets together: Not on file    Attends religious service: Not on file    Active member of club or organization: Not on file    Attends meetings of clubs or organizations: Not on file    Relationship status: Not on file  . Intimate partner violence    Fear of current or ex partner: Not on file    Emotionally abused: Not on file    Physically abused: Not on file    Forced sexual activity: Not on file  Other Topics Concern  . Not on file  Social History Narrative  . Not on file   Family History  Family history unknown: Yes    OBJECTIVE: Vitals:   07/09/19 1054  BP: 126/79  Pulse: 84  Resp: 20  Temp: 98.4 F (36.9 C)  SpO2: 95%    General appearance: alert; no  distress Head: NCAT Lungs: normal respiratory rate Extremities: no edema Skin: warm and dry; skin maceration to under left panniculus of abdomen, NTTP, no obvious drainage or bleeding Psychological: alert and cooperative; normal mood and affect  ASSESSMENT & PLAN:  1. Intertrigo     Meds ordered this encounter  Medications  . Clotrimazole 1 % OINT    Sig: Apply 1 application topically 2 (two) times daily.    Dispense:  56.7 g    Refill:  0    Order Specific Question:   Supervising Provider    Answer:   Eustace Moore [[id number]]   Daily cleansing of skin folds with a mild cleanser followed by drying of affected area with a soft cloth or hair dryer on a cool setting Airing of affected area when feasible Daily application of drying powders Use of absorbent material or clothing, such as  cotton or merino wool, to separate skin in folds Clotrimazole prescribed.  Use as instructed Follow up with PCP next week for recheck and to ensure your symptoms are improving Go to the ED if the patient has any new or worsening symptoms such as fever, chills, decreased appetite, decreased activity, drooling, vomiting, cough, rash, changes in bowel or bladder function, etc...  Reviewed expectations re: course of current medical issues. Questions answered. Outlined signs and symptoms indicating need for more acute intervention. Patient verbalized understanding. After Visit Summary given.   Rennis Harding, PA-C 07/09/19 1401

## 2019-09-01 DIAGNOSIS — E669 Obesity, unspecified: Secondary | ICD-10-CM | POA: Insufficient documentation

## 2019-09-01 DIAGNOSIS — M1990 Unspecified osteoarthritis, unspecified site: Secondary | ICD-10-CM | POA: Insufficient documentation

## 2019-09-01 DIAGNOSIS — N182 Chronic kidney disease, stage 2 (mild): Secondary | ICD-10-CM | POA: Insufficient documentation

## 2019-09-01 DIAGNOSIS — I129 Hypertensive chronic kidney disease with stage 1 through stage 4 chronic kidney disease, or unspecified chronic kidney disease: Secondary | ICD-10-CM | POA: Insufficient documentation

## 2019-09-01 DIAGNOSIS — I10 Essential (primary) hypertension: Secondary | ICD-10-CM | POA: Insufficient documentation

## 2019-09-01 DIAGNOSIS — E785 Hyperlipidemia, unspecified: Secondary | ICD-10-CM | POA: Insufficient documentation

## 2019-09-01 DIAGNOSIS — E1165 Type 2 diabetes mellitus with hyperglycemia: Secondary | ICD-10-CM

## 2019-09-01 DIAGNOSIS — M179 Osteoarthritis of knee, unspecified: Secondary | ICD-10-CM

## 2019-09-01 DIAGNOSIS — E1129 Type 2 diabetes mellitus with other diabetic kidney complication: Secondary | ICD-10-CM | POA: Insufficient documentation

## 2019-09-01 DIAGNOSIS — M1710 Unilateral primary osteoarthritis, unspecified knee: Secondary | ICD-10-CM | POA: Insufficient documentation

## 2019-12-23 ENCOUNTER — Encounter: Payer: Self-pay | Admitting: Family Medicine

## 2019-12-23 ENCOUNTER — Ambulatory Visit (INDEPENDENT_AMBULATORY_CARE_PROVIDER_SITE_OTHER): Payer: Medicare Other | Admitting: Family Medicine

## 2019-12-23 ENCOUNTER — Other Ambulatory Visit: Payer: Self-pay

## 2019-12-23 VITALS — BP 104/75 | HR 97 | Temp 97.6°F | Ht 64.0 in | Wt 132.2 lb

## 2019-12-23 DIAGNOSIS — I10 Essential (primary) hypertension: Secondary | ICD-10-CM

## 2019-12-23 DIAGNOSIS — E1165 Type 2 diabetes mellitus with hyperglycemia: Secondary | ICD-10-CM | POA: Diagnosis not present

## 2019-12-23 NOTE — Progress Notes (Signed)
New Patient Office Visit  Subjective:  Patient ID: Vanessa Aguilar, female    DOB: 1941/06/29  Age: 79 y.o. MRN: [id number]  CC:  Chief Complaint  Patient presents with  . Follow-up    3 month f/u check on diabetes. BS this am was 108    HPI [place] presents for DM-pt with glucose readings as needed-takes metformin 500mg  TID. Pt uses Gabapentin 300mg  at night to help sleep with peripheral neuropathy. No insulin Pt no longer taking lisinopril -HCTZ 12.5mg -only-bp in the morning-normal readings Pt lost weight after told she weighed to much-pt quit eating after dinner   Past Medical History:  Diagnosis Date  . Chronic kidney disease, stage 2 (mild)   . Diabetes mellitus without complication ([place])   . Essential (primary) hypertension   . Hyperlipidemia, unspecified   . Hypertension   . Hypertensive chronic kidney disease with stage 1 through stage 4 chronic kidney disease, or unspecified chronic kidney disease   . Obesity, unspecified   . Osteoarthritis of knee   . Type 2 diabetes mellitus with hyperglycemia ([place])   . Type 2 diabetes mellitus with other diabetic kidney complication ([place])   . Unspecified osteoarthritis, unspecified site     No past surgical history on file.  Family History  Family history unknown: Yes    Social History   Socioeconomic History  . Marital status: Married    Spouse name: Not on file  . Number of children: Not on file  . Years of education: Not on file  . Highest education level: Not on file  Occupational History  . Not on file  Tobacco Use  . Smoking status: Never Smoker  . Smokeless tobacco: Never Used  Substance and Sexual Activity  . Alcohol use: Not Currently  . Drug use: Not Currently  . Sexual activity: Not on file  Other Topics Concern  . Not on file  Social History Narrative  . Not on file   Social Determinants of Health   Financial Resource Strain:   . Difficulty of Paying Living Expenses:   Food Insecurity:    . Worried About Charity fundraiser in the Last Year:   . Arboriculturist in the Last Year:   Transportation Needs:   . Film/video editor (Medical):   Marland Kitchen Lack of Transportation (Non-Medical):   Physical Activity:   . Days of Exercise per Week:   . Minutes of Exercise per Session:   Stress:   . Feeling of Stress :   Social Connections:   . Frequency of Communication with Friends and Family:   . Frequency of Social Gatherings with Friends and Family:   . Attends Religious Services:   . Active Member of Clubs or Organizations:   . Attends Archivist Meetings:   Marland Kitchen Marital Status:   Intimate Partner Violence:   . Fear of Current or Ex-Partner:   . Emotionally Abused:   Marland Kitchen Physically Abused:   . Sexually Abused:     ROS Review of Systems  Constitutional: Negative.   HENT: Negative.   Eyes: Negative.        Glasses  Respiratory: Negative.   Cardiovascular: Negative.   Gastrointestinal: Negative.   Endocrine: Negative.   Genitourinary: Negative.   Musculoskeletal: Negative.   Allergic/Immunologic: Negative.   Neurological: Negative.   Hematological: Negative.   Psychiatric/Behavioral: Negative.     Objective:   Today's Vitals: BP 104/75 (BP Location: Right Arm, Patient Position: Sitting, Cuff Size:  Normal)   Pulse 97   Temp 97.6 F (36.4 C) (Temporal)   Ht 5\' 4"  (1.626 m)   Wt 132 lb 3.2 oz (60 kg)   SpO2 97%   BMI 22.69 kg/m   Physical Exam Constitutional:      Appearance: Normal appearance.  Cardiovascular:     Rate and Rhythm: Normal rate and regular rhythm.     Pulses: Normal pulses.     Heart sounds: Normal heart sounds.  Pulmonary:     Effort: Pulmonary effort is normal.     Breath sounds: Normal breath sounds.  Musculoskeletal:        General: Normal range of motion.     Cervical back: Normal range of motion and neck supple.     Comments: Venous stasis disease  Neurological:     Mental Status: She is alert and oriented to person,  place, and time.  Psychiatric:        Mood and Affect: Mood normal.        Behavior: Behavior normal.     Assessment & Plan:  1. Type 2 diabetes mellitus with hyperglycemia, unspecified whether long term insulin use ([facility]) Continue gabapentin for peripheral neuropathy-will consider decrease metformin if continued low A1c - Basic metabolic panel - Hemoglobin A1c  2. Essential (primary) hypertension Stay off HCTZ-low blood pressure-will recheck at home off all medications - Basic metabolic panel - Hemoglobin A1c  Outpatient Encounter Medications as of 12/23/2019  Medication Sig  . gabapentin (NEURONTIN) 300 MG capsule Take 300 mg by mouth at bedtime.  . hydrochlorothiazide (MICROZIDE) 12.5 MG capsule Take 12.5 mg by mouth daily.  12/25/2019 lisinopril (ZESTRIL) 20 MG tablet Take 20 mg by mouth daily.  . metFORMIN (GLUCOPHAGE) 500 MG tablet Take 500 mg by mouth daily.  . [DISCONTINUED] Clotrimazole 1 % OINT Apply 1 application topically 2 (two) times daily.  . [DISCONTINUED] pravastatin (PRAVACHOL) 40 MG tablet Take 40 mg by mouth daily.  . [DISCONTINUED] valACYclovir (VALTREX) 1000 MG tablet Take 1,000 mg by mouth daily.   No facility-administered encounter medications on file as of 12/23/2019.    Follow-up: 6 months  Izabella Marcantel 12/25/2019, MD

## 2019-12-23 NOTE — Patient Instructions (Signed)
° ° ° °  If you have lab work done today you will be contacted with your lab results within the next 2 weeks.  If you have not heard from us then please contact us. The fastest way to get your results is to register for My Chart. ° ° °IF you received an x-ray today, you will receive an invoice from [facility]. Please contact [facility] at [phone] with questions or concerns regarding your invoice.  ° °IF you received labwork today, you will receive an invoice from [facility]. Please contact [facility] at [phone] with questions or concerns regarding your invoice.  ° °Our billing staff will not be able to assist you with questions regarding bills from these companies. ° °You will be contacted with the lab results as soon as they are available. The fastest way to get your results is to activate your My Chart account. Instructions are located on the last page of this paperwork. If you have not heard from us regarding the results in 2 weeks, please contact this office. °  ° ° ° °

## 2019-12-24 DIAGNOSIS — I10 Essential (primary) hypertension: Secondary | ICD-10-CM | POA: Diagnosis not present

## 2019-12-24 DIAGNOSIS — E1165 Type 2 diabetes mellitus with hyperglycemia: Secondary | ICD-10-CM | POA: Diagnosis not present

## 2019-12-25 ENCOUNTER — Telehealth: Payer: Self-pay | Admitting: Emergency Medicine

## 2019-12-25 LAB — BASIC METABOLIC PANEL
BUN: 14 mg/dL (ref 7–25)
CO2: 33 mmol/L — ABNORMAL HIGH (ref 20–32)
Calcium: 10 mg/dL (ref 8.6–10.4)
Chloride: 101 mmol/L (ref 98–110)
Creat: 0.88 mg/dL (ref 0.60–0.88)
Glucose, Bld: 96 mg/dL (ref 65–99)
Potassium: 3.6 mmol/L (ref 3.5–5.3)
Sodium: 141 mmol/L (ref 135–146)

## 2019-12-25 LAB — HEMOGLOBIN A1C
Hgb A1c MFr Bld: 6.1 % of total Hgb — ABNORMAL HIGH (ref <5.7)
Mean Plasma Glucose: 128 (calc)
eAG (mmol/L): 7.1 (calc)

## 2019-12-25 NOTE — Telephone Encounter (Signed)
Patient was informed her lasbs looks great and her A1c is coming down

## 2019-12-25 NOTE — Telephone Encounter (Signed)
-----   Message from Wandra Feinstein, MD sent at 12/25/2019  8:55 AM EDT ----- Your blood work looks great

## 2020-02-10 ENCOUNTER — Other Ambulatory Visit: Payer: Self-pay

## 2020-02-10 DIAGNOSIS — E1165 Type 2 diabetes mellitus with hyperglycemia: Secondary | ICD-10-CM

## 2020-02-10 MED ORDER — ONETOUCH ULTRASOFT LANCETS MISC: 12 refills | Status: AC

## 2020-02-10 MED ORDER — GLUCOSE BLOOD VI STRP: ORAL_STRIP | 12 refills | Status: AC

## 2020-02-11 DIAGNOSIS — E1165 Type 2 diabetes mellitus with hyperglycemia: Secondary | ICD-10-CM | POA: Diagnosis not present

## 2020-03-06 ENCOUNTER — Other Ambulatory Visit: Payer: Self-pay | Admitting: Family Medicine

## 2020-03-06 NOTE — Telephone Encounter (Signed)
Please advise 

## 2020-03-26 DIAGNOSIS — R600 Localized edema: Secondary | ICD-10-CM | POA: Diagnosis not present

## 2020-03-26 DIAGNOSIS — Z0189 Encounter for other specified special examinations: Secondary | ICD-10-CM | POA: Diagnosis not present

## 2020-03-26 DIAGNOSIS — E1140 Type 2 diabetes mellitus with diabetic neuropathy, unspecified: Secondary | ICD-10-CM | POA: Diagnosis not present

## 2020-03-26 DIAGNOSIS — I10 Essential (primary) hypertension: Secondary | ICD-10-CM | POA: Diagnosis not present

## 2020-04-08 DIAGNOSIS — Z0000 Encounter for general adult medical examination without abnormal findings: Secondary | ICD-10-CM | POA: Diagnosis not present

## 2020-04-08 DIAGNOSIS — R7301 Impaired fasting glucose: Secondary | ICD-10-CM | POA: Diagnosis not present

## 2020-04-15 DIAGNOSIS — T466X5S Adverse effect of antihyperlipidemic and antiarteriosclerotic drugs, sequela: Secondary | ICD-10-CM | POA: Diagnosis not present

## 2020-04-15 DIAGNOSIS — E1140 Type 2 diabetes mellitus with diabetic neuropathy, unspecified: Secondary | ICD-10-CM | POA: Diagnosis not present

## 2020-04-15 DIAGNOSIS — I10 Essential (primary) hypertension: Secondary | ICD-10-CM | POA: Diagnosis not present

## 2020-04-15 DIAGNOSIS — R600 Localized edema: Secondary | ICD-10-CM | POA: Diagnosis not present

## 2020-04-29 DIAGNOSIS — I10 Essential (primary) hypertension: Secondary | ICD-10-CM | POA: Diagnosis not present

## 2020-04-29 DIAGNOSIS — R600 Localized edema: Secondary | ICD-10-CM | POA: Diagnosis not present

## 2020-07-31 DIAGNOSIS — Z23 Encounter for immunization: Secondary | ICD-10-CM | POA: Diagnosis not present

## 2020-08-14 DIAGNOSIS — Z712 Person consulting for explanation of examination or test findings: Secondary | ICD-10-CM | POA: Diagnosis not present

## 2020-08-14 DIAGNOSIS — Z0000 Encounter for general adult medical examination without abnormal findings: Secondary | ICD-10-CM | POA: Diagnosis not present

## 2020-08-14 DIAGNOSIS — R7301 Impaired fasting glucose: Secondary | ICD-10-CM | POA: Diagnosis not present

## 2020-08-14 DIAGNOSIS — Z23 Encounter for immunization: Secondary | ICD-10-CM | POA: Diagnosis not present

## 2020-09-02 DIAGNOSIS — I10 Essential (primary) hypertension: Secondary | ICD-10-CM | POA: Diagnosis not present

## 2020-09-02 DIAGNOSIS — T466X5S Adverse effect of antihyperlipidemic and antiarteriosclerotic drugs, sequela: Secondary | ICD-10-CM | POA: Diagnosis not present

## 2020-09-02 DIAGNOSIS — R600 Localized edema: Secondary | ICD-10-CM | POA: Diagnosis not present

## 2020-09-02 DIAGNOSIS — E1140 Type 2 diabetes mellitus with diabetic neuropathy, unspecified: Secondary | ICD-10-CM | POA: Diagnosis not present

## 2020-11-21 DIAGNOSIS — E1122 Type 2 diabetes mellitus with diabetic chronic kidney disease: Secondary | ICD-10-CM | POA: Diagnosis not present

## 2020-12-09 DIAGNOSIS — M170 Bilateral primary osteoarthritis of knee: Secondary | ICD-10-CM | POA: Diagnosis not present

## 2020-12-17 DIAGNOSIS — H2513 Age-related nuclear cataract, bilateral: Secondary | ICD-10-CM | POA: Diagnosis not present

## 2020-12-17 DIAGNOSIS — H25013 Cortical age-related cataract, bilateral: Secondary | ICD-10-CM | POA: Diagnosis not present

## 2021-01-05 DIAGNOSIS — H2511 Age-related nuclear cataract, right eye: Secondary | ICD-10-CM | POA: Diagnosis not present

## 2021-01-05 DIAGNOSIS — H25811 Combined forms of age-related cataract, right eye: Secondary | ICD-10-CM | POA: Diagnosis not present

## 2021-01-05 DIAGNOSIS — H25011 Cortical age-related cataract, right eye: Secondary | ICD-10-CM | POA: Diagnosis not present

## 2021-02-16 DIAGNOSIS — H2512 Age-related nuclear cataract, left eye: Secondary | ICD-10-CM | POA: Diagnosis not present

## 2021-02-16 DIAGNOSIS — H25811 Combined forms of age-related cataract, right eye: Secondary | ICD-10-CM | POA: Diagnosis not present

## 2021-02-16 DIAGNOSIS — H25012 Cortical age-related cataract, left eye: Secondary | ICD-10-CM | POA: Diagnosis not present

## 2021-02-16 DIAGNOSIS — H25812 Combined forms of age-related cataract, left eye: Secondary | ICD-10-CM | POA: Diagnosis not present

## 2021-03-19 DIAGNOSIS — I10 Essential (primary) hypertension: Secondary | ICD-10-CM | POA: Diagnosis not present

## 2021-03-19 DIAGNOSIS — E1140 Type 2 diabetes mellitus with diabetic neuropathy, unspecified: Secondary | ICD-10-CM | POA: Diagnosis not present

## 2021-03-24 DIAGNOSIS — E1140 Type 2 diabetes mellitus with diabetic neuropathy, unspecified: Secondary | ICD-10-CM | POA: Diagnosis not present

## 2021-03-24 DIAGNOSIS — R944 Abnormal results of kidney function studies: Secondary | ICD-10-CM | POA: Diagnosis not present

## 2021-03-24 DIAGNOSIS — I10 Essential (primary) hypertension: Secondary | ICD-10-CM | POA: Diagnosis not present

## 2021-03-24 DIAGNOSIS — E782 Mixed hyperlipidemia: Secondary | ICD-10-CM | POA: Diagnosis not present

## 2021-04-15 DIAGNOSIS — Z961 Presence of intraocular lens: Secondary | ICD-10-CM | POA: Diagnosis not present

## 2021-07-16 DIAGNOSIS — Z23 Encounter for immunization: Secondary | ICD-10-CM | POA: Diagnosis not present

## 2021-07-16 DIAGNOSIS — E1140 Type 2 diabetes mellitus with diabetic neuropathy, unspecified: Secondary | ICD-10-CM | POA: Diagnosis not present

## 2021-07-22 DIAGNOSIS — I10 Essential (primary) hypertension: Secondary | ICD-10-CM | POA: Diagnosis not present

## 2021-07-22 DIAGNOSIS — E1140 Type 2 diabetes mellitus with diabetic neuropathy, unspecified: Secondary | ICD-10-CM | POA: Diagnosis not present

## 2021-07-22 DIAGNOSIS — R600 Localized edema: Secondary | ICD-10-CM | POA: Diagnosis not present

## 2021-07-22 DIAGNOSIS — T466X5S Adverse effect of antihyperlipidemic and antiarteriosclerotic drugs, sequela: Secondary | ICD-10-CM | POA: Diagnosis not present

## 2021-08-03 ENCOUNTER — Other Ambulatory Visit: Payer: Self-pay

## 2021-08-03 ENCOUNTER — Ambulatory Visit (INDEPENDENT_AMBULATORY_CARE_PROVIDER_SITE_OTHER): Payer: Medicare Other | Admitting: Orthopedic Surgery

## 2021-08-03 ENCOUNTER — Ambulatory Visit: Payer: Medicare Other

## 2021-08-03 ENCOUNTER — Encounter: Payer: Self-pay | Admitting: Orthopedic Surgery

## 2021-08-03 VITALS — BP 153/91 | HR 110 | Ht 64.0 in | Wt 143.0 lb

## 2021-08-03 DIAGNOSIS — M170 Bilateral primary osteoarthritis of knee: Secondary | ICD-10-CM

## 2021-08-03 DIAGNOSIS — M25562 Pain in left knee: Secondary | ICD-10-CM | POA: Diagnosis not present

## 2021-08-03 DIAGNOSIS — G8929 Other chronic pain: Secondary | ICD-10-CM

## 2021-08-03 DIAGNOSIS — M25561 Pain in right knee: Secondary | ICD-10-CM | POA: Diagnosis not present

## 2021-08-03 NOTE — Patient Instructions (Signed)

## 2021-08-03 NOTE — Progress Notes (Signed)
New Patient Visit  Assessment: Vanessa Aguilar is a 80 y.o. female with the following: Severe bilateral knee arthritis   Plan: Vanessa Aguilar has severe arthritis in bilateral knees.  We reviewed the radiographs in clinic today, and I outlined the natural progression.  We had an extensive discussion regarding all potential treatment options, including continuing with the current treatment. NSAIDs are the most appropriate medications, and these are available OTC or via prescription.  I have urged them to remain active, and they can continue with activities on their own, or we can refer them to physical therapy.  We can also consider a brace, or compression sleeve. If the pain is severe enough, we can consider a steroid injection.  If their knee pain is affecting their everyday activities, including sleep, knee replacement is a consideration, but we would have to refer them to see my partner Dr. Romeo Apple.  Can consider viscosupplementation, but there are no guarantees this can help with the pain.  This can also be expensive if insurance does not cover the injection.   After discussing all of these options, the patient has elected to proceed with bilateral steroid injections.   Procedure note injection Left knee joint   Verbal consent was obtained to inject the left knee joint  Timeout was completed to confirm the site of injection.  The skin was prepped with alcohol and ethyl chloride was sprayed at the injection site.  A 21-gauge needle was used to inject 40 mg of Depo-Medrol and 1% lidocaine (3 cc) into the left knee using an anterolateral approach.  There were no complications. A sterile bandage was applied.     Procedure note injection Right knee joint   Verbal consent was obtained to inject the right knee joint  Timeout was completed to confirm the site of injection.  The skin was prepped with alcohol and ethyl chloride was sprayed at the injection site.  A 21-gauge needle was  used to inject 40 mg of Depo-Medrol and 1% lidocaine (3 cc) into the left knee using an anterolateral approach.  There were no complications. A sterile bandage was applied.      Follow-up: Return if symptoms worsen or fail to improve.  Subjective:  Chief Complaint  Patient presents with   Knee Pain    Bilat knee pain. Has had injections in the past in both knee Lt> Rt.     History of Present Illness: Vanessa Aguilar is a 80 y.o. female who presents for evaluation of bilateral knee pain.  She has been referred by [name], MD.  She has had progressively worsening bilateral knee pain for several years.  Left is currently worse that right.  She has been getting knee injections by her PCP every 6 months, with good effect.  No specific injury.  She uses a walker and cane to assist with ambulation.  She is not interested in surgery.  Her PCP recommended we discuss viscosupplementation injections.    Review of Systems: No fevers or chills No numbness or tingling No chest pain No shortness of breath No bowel or bladder dysfunction No GI distress No headaches   Medical History:  Past Medical History:  Diagnosis Date   DM (diabetes mellitus), type 2 ([facility])    Hypertension     History reviewed. No pertinent surgical history.  History reviewed. No pertinent family history. Social History   Tobacco Use   Smoking status: Never    Passive exposure: Never   Smokeless tobacco: Never  No Known Allergies  Current Meds  Medication Sig   gabapentin (NEURONTIN) 300 MG capsule Take 300 mg by mouth 3 (three) times daily.   hydrochlorothiazide (MICROZIDE) 12.5 MG capsule Take 12.5 mg by mouth daily.   lisinopril (ZESTRIL) 20 MG tablet Take 20 mg by mouth daily.   metFORMIN (GLUCOPHAGE) 500 MG tablet Take by mouth 2 (two) times daily with a meal.    Objective: BP (!) 153/91   Pulse (!) 110   Ht 5\' 4"  (1.626 m)   Wt 143 lb (64.9 kg)   BMI 24.55 kg/m   Physical  Exam:  General: Elderly female., Alert and oriented., and No acute distress. Gait: Slow, steady gait.  Bilateral knees with mild effusion. Diffuse tenderness throughout the knee. Left 5-95, Right 5-110.  She has a cyst in the posterior left knee.   IMAGING: I personally ordered and reviewed the following images  XR of the bilateral knees demonstrates severe degenerative changes.  She has bone on bone articulation within the medial compartment.  Osteophyte formation throughout the knee.  Mild varus alignment overall.    Impression: severe bilateral knee arthritis with complete loss of joint space within the medial compartment.   New Medications:  No orders of the defined types were placed in this encounter.    , MD  08/03/2021 10:06 AM

## 2021-08-09 ENCOUNTER — Encounter: Payer: Self-pay | Admitting: Family Medicine

## 2021-11-19 DIAGNOSIS — E782 Mixed hyperlipidemia: Secondary | ICD-10-CM | POA: Diagnosis not present

## 2021-11-19 DIAGNOSIS — E1140 Type 2 diabetes mellitus with diabetic neuropathy, unspecified: Secondary | ICD-10-CM | POA: Diagnosis not present

## 2021-11-26 DIAGNOSIS — E782 Mixed hyperlipidemia: Secondary | ICD-10-CM | POA: Diagnosis not present

## 2021-11-26 DIAGNOSIS — R600 Localized edema: Secondary | ICD-10-CM | POA: Diagnosis not present

## 2021-11-26 DIAGNOSIS — E1140 Type 2 diabetes mellitus with diabetic neuropathy, unspecified: Secondary | ICD-10-CM | POA: Diagnosis not present

## 2021-11-26 DIAGNOSIS — I10 Essential (primary) hypertension: Secondary | ICD-10-CM | POA: Diagnosis not present

## 2022-03-21 DIAGNOSIS — I10 Essential (primary) hypertension: Secondary | ICD-10-CM | POA: Diagnosis not present

## 2022-03-21 DIAGNOSIS — E785 Hyperlipidemia, unspecified: Secondary | ICD-10-CM | POA: Diagnosis not present

## 2022-03-21 DIAGNOSIS — E1140 Type 2 diabetes mellitus with diabetic neuropathy, unspecified: Secondary | ICD-10-CM | POA: Diagnosis not present

## 2022-03-24 DIAGNOSIS — E782 Mixed hyperlipidemia: Secondary | ICD-10-CM | POA: Diagnosis not present

## 2022-03-24 DIAGNOSIS — E1140 Type 2 diabetes mellitus with diabetic neuropathy, unspecified: Secondary | ICD-10-CM | POA: Diagnosis not present

## 2022-03-24 DIAGNOSIS — T466X5S Adverse effect of antihyperlipidemic and antiarteriosclerotic drugs, sequela: Secondary | ICD-10-CM | POA: Diagnosis not present

## 2022-03-24 DIAGNOSIS — R809 Proteinuria, unspecified: Secondary | ICD-10-CM | POA: Diagnosis not present

## 2022-03-30 DIAGNOSIS — R2689 Other abnormalities of gait and mobility: Secondary | ICD-10-CM | POA: Diagnosis not present

## 2022-05-19 DIAGNOSIS — H524 Presbyopia: Secondary | ICD-10-CM | POA: Diagnosis not present

## 2022-10-18 DIAGNOSIS — E1140 Type 2 diabetes mellitus with diabetic neuropathy, unspecified: Secondary | ICD-10-CM | POA: Diagnosis not present

## 2022-10-18 DIAGNOSIS — E782 Mixed hyperlipidemia: Secondary | ICD-10-CM | POA: Diagnosis not present

## 2022-10-21 DIAGNOSIS — I10 Essential (primary) hypertension: Secondary | ICD-10-CM | POA: Diagnosis not present

## 2022-10-21 DIAGNOSIS — R809 Proteinuria, unspecified: Secondary | ICD-10-CM | POA: Diagnosis not present

## 2022-10-21 DIAGNOSIS — E782 Mixed hyperlipidemia: Secondary | ICD-10-CM | POA: Diagnosis not present

## 2022-10-21 DIAGNOSIS — Z0000 Encounter for general adult medical examination without abnormal findings: Secondary | ICD-10-CM | POA: Diagnosis not present

## 2022-10-21 DIAGNOSIS — E1140 Type 2 diabetes mellitus with diabetic neuropathy, unspecified: Secondary | ICD-10-CM | POA: Diagnosis not present

## 2022-10-21 DIAGNOSIS — T466X5S Adverse effect of antihyperlipidemic and antiarteriosclerotic drugs, sequela: Secondary | ICD-10-CM | POA: Diagnosis not present

## 2022-12-23 ENCOUNTER — Ambulatory Visit: Payer: Medicare Other | Admitting: Orthopedic Surgery

## 2023-01-30 ENCOUNTER — Encounter (HOSPITAL_COMMUNITY): Payer: Self-pay | Admitting: Emergency Medicine

## 2023-01-30 ENCOUNTER — Other Ambulatory Visit: Payer: Self-pay

## 2023-01-30 ENCOUNTER — Inpatient Hospital Stay (HOSPITAL_COMMUNITY)
Admission: EM | Admit: 2023-01-30 | Discharge: 2023-02-01 | DRG: 544 | Disposition: A | Discharge status: Skilled Nursing Facility | Payer: Medicare Other | Attending: Internal Medicine | Admitting: Internal Medicine

## 2023-01-30 ENCOUNTER — Emergency Department (HOSPITAL_COMMUNITY): Payer: Medicare Other

## 2023-01-30 DIAGNOSIS — E1122 Type 2 diabetes mellitus with diabetic chronic kidney disease: Secondary | ICD-10-CM | POA: Diagnosis not present

## 2023-01-30 DIAGNOSIS — Z79899 Other long term (current) drug therapy: Secondary | ICD-10-CM | POA: Diagnosis not present

## 2023-01-30 DIAGNOSIS — Z9181 History of falling: Secondary | ICD-10-CM | POA: Diagnosis not present

## 2023-01-30 DIAGNOSIS — R41841 Cognitive communication deficit: Secondary | ICD-10-CM | POA: Diagnosis not present

## 2023-01-30 DIAGNOSIS — M1990 Unspecified osteoarthritis, unspecified site: Secondary | ICD-10-CM | POA: Diagnosis present

## 2023-01-30 DIAGNOSIS — E785 Hyperlipidemia, unspecified: Secondary | ICD-10-CM | POA: Diagnosis not present

## 2023-01-30 DIAGNOSIS — M549 Dorsalgia, unspecified: Secondary | ICD-10-CM | POA: Diagnosis present

## 2023-01-30 DIAGNOSIS — S32030S Wedge compression fracture of third lumbar vertebra, sequela: Secondary | ICD-10-CM | POA: Diagnosis not present

## 2023-01-30 DIAGNOSIS — W010XXA Fall on same level from slipping, tripping and stumbling without subsequent striking against object, initial encounter: Secondary | ICD-10-CM | POA: Diagnosis present

## 2023-01-30 DIAGNOSIS — N182 Chronic kidney disease, stage 2 (mild): Secondary | ICD-10-CM | POA: Diagnosis present

## 2023-01-30 DIAGNOSIS — Z7984 Long term (current) use of oral hypoglycemic drugs: Secondary | ICD-10-CM

## 2023-01-30 DIAGNOSIS — I10 Essential (primary) hypertension: Secondary | ICD-10-CM | POA: Diagnosis not present

## 2023-01-30 DIAGNOSIS — M6281 Muscle weakness (generalized): Secondary | ICD-10-CM | POA: Diagnosis not present

## 2023-01-30 DIAGNOSIS — S32030D Wedge compression fracture of third lumbar vertebra, subsequent encounter for fracture with routine healing: Secondary | ICD-10-CM | POA: Diagnosis not present

## 2023-01-30 DIAGNOSIS — M62522 Muscle wasting and atrophy, not elsewhere classified, left upper arm: Secondary | ICD-10-CM | POA: Diagnosis not present

## 2023-01-30 DIAGNOSIS — E669 Obesity, unspecified: Secondary | ICD-10-CM | POA: Diagnosis not present

## 2023-01-30 DIAGNOSIS — M4856XA Collapsed vertebra, not elsewhere classified, lumbar region, initial encounter for fracture: Principal | ICD-10-CM | POA: Diagnosis present

## 2023-01-30 DIAGNOSIS — Z66 Do not resuscitate: Secondary | ICD-10-CM | POA: Diagnosis present

## 2023-01-30 DIAGNOSIS — Z743 Need for continuous supervision: Secondary | ICD-10-CM | POA: Diagnosis not present

## 2023-01-30 DIAGNOSIS — M62561 Muscle wasting and atrophy, not elsewhere classified, right lower leg: Secondary | ICD-10-CM | POA: Diagnosis not present

## 2023-01-30 DIAGNOSIS — R4789 Other speech disturbances: Secondary | ICD-10-CM | POA: Diagnosis not present

## 2023-01-30 DIAGNOSIS — R531 Weakness: Secondary | ICD-10-CM | POA: Diagnosis not present

## 2023-01-30 DIAGNOSIS — Y92019 Unspecified place in single-family (private) house as the place of occurrence of the external cause: Secondary | ICD-10-CM

## 2023-01-30 DIAGNOSIS — I129 Hypertensive chronic kidney disease with stage 1 through stage 4 chronic kidney disease, or unspecified chronic kidney disease: Secondary | ICD-10-CM | POA: Diagnosis not present

## 2023-01-30 DIAGNOSIS — M5136 Other intervertebral disc degeneration, lumbar region: Secondary | ICD-10-CM | POA: Diagnosis not present

## 2023-01-30 DIAGNOSIS — W19XXXA Unspecified fall, initial encounter: Secondary | ICD-10-CM | POA: Diagnosis not present

## 2023-01-30 DIAGNOSIS — E1129 Type 2 diabetes mellitus with other diabetic kidney complication: Secondary | ICD-10-CM | POA: Diagnosis present

## 2023-01-30 DIAGNOSIS — E876 Hypokalemia: Secondary | ICD-10-CM | POA: Insufficient documentation

## 2023-01-30 DIAGNOSIS — S32030A Wedge compression fracture of third lumbar vertebra, initial encounter for closed fracture: Secondary | ICD-10-CM

## 2023-01-30 DIAGNOSIS — M62521 Muscle wasting and atrophy, not elsewhere classified, right upper arm: Secondary | ICD-10-CM | POA: Diagnosis not present

## 2023-01-30 DIAGNOSIS — M62562 Muscle wasting and atrophy, not elsewhere classified, left lower leg: Secondary | ICD-10-CM | POA: Diagnosis not present

## 2023-01-30 DIAGNOSIS — R279 Unspecified lack of coordination: Secondary | ICD-10-CM | POA: Diagnosis not present

## 2023-01-30 DIAGNOSIS — R2689 Other abnormalities of gait and mobility: Secondary | ICD-10-CM | POA: Diagnosis not present

## 2023-01-30 LAB — CBC WITH DIFFERENTIAL/PLATELET
Abs Immature Granulocytes: 0.03 10*3/uL (ref 0.00–0.07)
Basophils Absolute: 0 10*3/uL (ref 0.0–0.1)
Basophils Relative: 0 %
Eosinophils Absolute: 0.1 10*3/uL (ref 0.0–0.5)
Eosinophils Relative: 2 %
HCT: 37.1 % (ref 36.0–46.0)
Hemoglobin: 12.2 g/dL (ref 12.0–15.0)
Immature Granulocytes: 0 %
Lymphocytes Relative: 28 %
Lymphs Abs: 2.1 10*3/uL (ref 0.7–4.0)
MCH: 29.4 pg (ref 26.0–34.0)
MCHC: 32.9 g/dL (ref 30.0–36.0)
MCV: 89.4 fL (ref 80.0–100.0)
Monocytes Absolute: 0.8 10*3/uL (ref 0.1–1.0)
Monocytes Relative: 10 %
Neutro Abs: 4.5 10*3/uL (ref 1.7–7.7)
Neutrophils Relative %: 60 %
Platelets: 228 10*3/uL (ref 150–400)
RBC: 4.15 MIL/uL (ref 3.87–5.11)
RDW: 13.7 % (ref 11.5–15.5)
WBC: 7.6 10*3/uL (ref 4.0–10.5)
nRBC: 0 % (ref 0.0–0.2)

## 2023-01-30 LAB — BASIC METABOLIC PANEL
Anion gap: 8 (ref 5–15)
BUN: 17 mg/dL (ref 8–23)
CO2: 27 mmol/L (ref 22–32)
Calcium: 9.1 mg/dL (ref 8.9–10.3)
Chloride: 100 mmol/L (ref 98–111)
Creatinine, Ser: 0.76 mg/dL (ref 0.44–1.00)
GFR, Estimated: >60 mL/min (ref >60)
Glucose, Bld: 112 mg/dL — ABNORMAL HIGH (ref 70–99)
Potassium: 3.4 mmol/L — ABNORMAL LOW (ref 3.5–5.1)
Sodium: 135 mmol/L (ref 135–145)

## 2023-01-30 MED ORDER — HYDROCODONE-ACETAMINOPHEN 5-325 MG PO TABS
1.0000 | ORAL_TABLET | Freq: Once | ORAL | Status: AC
  Administered 2023-01-30: 1 via ORAL
  Filled 2023-01-30: qty 1

## 2023-01-30 NOTE — ED Notes (Signed)
Pt unable to use bedpan due to pain. Brief placed on pt. Pt aware to let staff know when she has urinated, so we can change her. RN and family member at bedside during this time

## 2023-01-30 NOTE — ED Notes (Signed)
Notified [facility] of patient needing a TLSO brace. 

## 2023-01-30 NOTE — ED Provider Notes (Signed)
[place] [facility]GENC[facility] DE[facility]MENT AT [facility] Provider Note   CSN: [id number] Arrival date & time: 01/30/23  1446     History  Chief Complaint  Patient presents with   Vanessa Aguilar is a 82 y.o. female.  Patient presents the emergency department complaining of low back pain.  Patient states that she fell at home on Wednesday, falling directly on her buttocks.  She states that since that time she has had worsening lower back pain which radiates down both legs.  She states that on Wednesday after the fall she was able to go to church but that the pain is continued to increase daily.  She denies hitting her head, denies blood thinner usage, denies loss of consciousness.  Past medical history significant for type II DM, hypertension, CKD stage II, osteoarthritis  HPI     Home Medications Prior to Admission medications   Medication Sig Start Date End Date Taking? Authorizing Provider  gabapentin (NEURONTIN) 300 MG capsule Take 300 mg by mouth at bedtime.    [provider]  gabapentin (NEURONTIN) 300 MG capsule Take 300 mg by mouth 3 (three) times daily.    [provider]  glucose blood test strip Use as instructed 02/10/20   Corum, Minerva Fester, MD  hydrochlorothiazide (MICROZIDE) 12.5 MG capsule Take 12.5 mg by mouth daily. 05/09/19   [provider]  hydrochlorothiazide (MICROZIDE) 12.5 MG capsule Take 12.5 mg by mouth daily.    [provider]  Lancets Letta Pate ULTRASOFT) lancets Use as instructed 02/10/20   Wandra Feinstein, MD  lisinopril (ZESTRIL) 20 MG tablet Take 20 mg by mouth daily.    [provider]  lisinopril (ZESTRIL) 20 MG tablet Take 20 mg by mouth daily.    [provider]  metFORMIN (GLUCOPHAGE) 500 MG tablet Take 1 tablet (500 mg total) by mouth 2 (two) times daily. 03/09/20   Corum, Minerva Fester, MD  metFORMIN (GLUCOPHAGE) 500 MG tablet Take by mouth 2 (two) times daily with a meal.    [provider]       Allergies    Patient has no known allergies.    Review of Systems   Review of Systems  Physical Exam Updated Vital Signs BP (!) 166/60 (BP Location: Left Arm)   Pulse 81   Temp 98 F (36.7 C) (Oral)   Resp 20   Ht 5\' 4"  (1.626 m)   Wt 64.9 kg   SpO2 95%   BMI 24.56 kg/m  Physical Exam Vitals and nursing note reviewed.  Constitutional:      General: She is not in acute distress.    Appearance: She is well-developed.  HENT:     Head: Normocephalic and atraumatic.  Eyes:     Conjunctiva/sclera: Conjunctivae normal.  Cardiovascular:     Rate and Rhythm: Normal rate and regular rhythm.     Heart sounds: No murmur heard. Pulmonary:     Effort: Pulmonary effort is normal. No respiratory distress.     Breath sounds: Normal breath sounds.  Abdominal:     Palpations: Abdomen is soft.     Tenderness: There is no abdominal tenderness.  Musculoskeletal:        General: Tenderness present. No swelling.     Cervical back: Neck supple.     Comments: Midline lumbar spine tenderness  Skin:    General: Skin is warm and dry.     Capillary Refill: Capillary refill takes less than 2  seconds.  Neurological:     General: No focal deficit present.     Mental Status: She is alert.  Psychiatric:        Mood and Affect: Mood normal.     ED Results / Procedures / Treatments   Labs (all labs ordered are listed, but only abnormal results are displayed) Labs Reviewed  BASIC METABOLIC PANEL - Abnormal; Notable for the following components:      Result Value   Potassium 3.4 (*)    Glucose, Bld 112 (*)    All other components within normal limits  CBC WITH DIFFERENTIAL/PLATELET    EKG None  Radiology CT Lumbar Spine Wo Contrast  Result Date: 01/30/2023 CLINICAL DATA:  Back trauma EXAM: CT LUMBAR SPINE WITHOUT CONTRAST TECHNIQUE: Multidetector CT imaging of the lumbar spine was performed without intravenous contrast administration. Multiplanar CT image reconstructions were  also generated. RADIATION DOSE REDUCTION: This exam was performed according to the departmental dose-optimization program which includes automated exposure control, adjustment of the mA and/or kV according to patient size and/or use of iterative reconstruction technique. COMPARISON:  None Available. FINDINGS: Segmentation: 5 lumbar type vertebrae. Alignment: There is 4 mm of anterolisthesis at L3-L4. Vertebrae: Bones are diffusely osteopenic. There is acute appearing compression fracture of L3 with 50% loss vertebral body height. There is no retropulsion of fracture fragments. No other focal osseous lesion identified. Paraspinal and other soft tissues: There is mild paravertebral edema anterior to L2, L3 and L4. There is no focal hematoma. There are atherosclerotic calcifications of the aorta. Gallstones are present. Disc levels: There is mild disc space narrowing at L3-L4. There is moderate disc space narrowing at L5-S1. Vacuum disc phenomena is seen at L3-L4 and L4-L5. L1-L2: Within normal limits. L2-L3: Mild disc bulge and bilateral facet arthropathy. Mild central canal stenosis. No neural foraminal stenosis. L3-L4: Broad-based disc bulge and bilateral facet arthropathy causing moderate severe central canal stenosis at this level. Mild bilateral neural foraminal stenosis. L4-L5: Broad-based disc bulge with bilateral facet arthropathy. No central canal or neural foraminal stenosis. L5-S1: Posterior disc osteophyte complex. Moderate right neural foraminal stenosis. No central canal stenosis. IMPRESSION: 1. Acute compression fracture of L3 with 50% loss vertebral body height. No retropulsion of fracture fragments. 2. Multilevel degenerative changes of the lumbar spine, most severe at L3-L4 where there is moderate to severe central canal stenosis. 3. Cholelithiasis. Aortic Atherosclerosis (ICD10-I70.0). Electronically Signed   By: Darliss Cheney M.D.   On: 01/30/2023 18:02    Procedures .Ortho Injury  Treatment  Date/Time: 01/30/2023 8:00 PM  Performed by: Darrick Grinder, PA-C Authorized by: Darrick Grinder, PA-C   Consent:    Consent obtained:  Verbal   Consent given by:  Patient   Risks discussed:  Nerve damage, restricted joint movement, vascular damage, stiffness and recurrent dislocation   Alternatives discussed:  No treatmentInjury location: spine Location details L3 Injury type: fracture Fracture type: vertebral body Pre-procedure neurovascular assessment: neurovascularly intact Immobilization: brace (TLSO) Post-procedure neurovascular assessment: post-procedure neurovascularly intact       Medications Ordered in ED Medications  HYDROcodone-acetaminophen (NORCO/VICODIN) 5-325 MG per tablet 1 tablet (1 tablet Oral Given 01/30/23 1607)  HYDROcodone-acetaminophen (NORCO/VICODIN) 5-325 MG per tablet 1 tablet (1 tablet Oral Given 01/30/23 1900)    ED Course/ Medical Decision Making/ A&P                             Medical Decision Making Amount and/or  Complexity of Data Reviewed Labs: ordered. Radiology: ordered.  Risk Prescription drug management.   Patient presents to the emergency department the chief complaint of low back pain.  Differential diagnosis includes but is not limited to fracture, dislocation, soft tissue injury, and others  Patient has comorbidities including osteoarthritis  I ordered and interpreted imaging including CT lumbar spine.   1. Acute compression fracture of L3 with 50% loss vertebral body  height. No retropulsion of fracture fragments.  2. Multilevel degenerative changes of the lumbar spine, most severe  at L3-L4 where there is moderate to severe central canal stenosis.  3. Cholelithiasis.  I agree with the radiologist findings  I ordered the patient hydrocodone for pain.  Upon reassessment she was feeling somewhat better.  I had the patient placed in a TLSO brace.  Patient unable to ambulate at this time due to pain.  Plan  to admit patient for evaluation by PT/OT and possible rehab placement. Discussed with Dr.Zierle-Ghosh of the hospitalist service who agrees to see patient for admission       Final Clinical Impression(s) / ED Diagnoses Final diagnoses:  Closed compression fracture of L3 lumbar vertebra, initial encounter [facility])    Rx / DC Orders ED Discharge Orders     None         Pamala Duffel 01/30/23 2305    Pricilla Loveless, MD 02/03/23 2215

## 2023-01-30 NOTE — ED Notes (Signed)
Attempted to call report x 1  

## 2023-01-30 NOTE — ED Triage Notes (Signed)
Pt fell at home Wednesday and since has had lower back pain that radiates to hips. Pt denies hitting her head.

## 2023-01-30 NOTE — ED Notes (Signed)
Sandwich, chips and sprite given  

## 2023-01-31 DIAGNOSIS — Z66 Do not resuscitate: Secondary | ICD-10-CM | POA: Diagnosis present

## 2023-01-31 DIAGNOSIS — W010XXA Fall on same level from slipping, tripping and stumbling without subsequent striking against object, initial encounter: Secondary | ICD-10-CM | POA: Diagnosis present

## 2023-01-31 DIAGNOSIS — Y92019 Unspecified place in single-family (private) house as the place of occurrence of the external cause: Secondary | ICD-10-CM | POA: Diagnosis not present

## 2023-01-31 DIAGNOSIS — S32030A Wedge compression fracture of third lumbar vertebra, initial encounter for closed fracture: Secondary | ICD-10-CM | POA: Diagnosis not present

## 2023-01-31 DIAGNOSIS — E1122 Type 2 diabetes mellitus with diabetic chronic kidney disease: Secondary | ICD-10-CM | POA: Diagnosis present

## 2023-01-31 DIAGNOSIS — Z79899 Other long term (current) drug therapy: Secondary | ICD-10-CM | POA: Diagnosis not present

## 2023-01-31 DIAGNOSIS — E1129 Type 2 diabetes mellitus with other diabetic kidney complication: Secondary | ICD-10-CM

## 2023-01-31 DIAGNOSIS — E876 Hypokalemia: Secondary | ICD-10-CM | POA: Diagnosis present

## 2023-01-31 DIAGNOSIS — M549 Dorsalgia, unspecified: Secondary | ICD-10-CM

## 2023-01-31 DIAGNOSIS — R531 Weakness: Secondary | ICD-10-CM

## 2023-01-31 DIAGNOSIS — N182 Chronic kidney disease, stage 2 (mild): Secondary | ICD-10-CM | POA: Diagnosis present

## 2023-01-31 DIAGNOSIS — M4856XA Collapsed vertebra, not elsewhere classified, lumbar region, initial encounter for fracture: Secondary | ICD-10-CM | POA: Diagnosis present

## 2023-01-31 DIAGNOSIS — I10 Essential (primary) hypertension: Secondary | ICD-10-CM

## 2023-01-31 DIAGNOSIS — I129 Hypertensive chronic kidney disease with stage 1 through stage 4 chronic kidney disease, or unspecified chronic kidney disease: Secondary | ICD-10-CM | POA: Diagnosis present

## 2023-01-31 DIAGNOSIS — M1990 Unspecified osteoarthritis, unspecified site: Secondary | ICD-10-CM | POA: Diagnosis present

## 2023-01-31 DIAGNOSIS — Z7984 Long term (current) use of oral hypoglycemic drugs: Secondary | ICD-10-CM | POA: Diagnosis not present

## 2023-01-31 DIAGNOSIS — E785 Hyperlipidemia, unspecified: Secondary | ICD-10-CM | POA: Diagnosis present

## 2023-01-31 LAB — COMPREHENSIVE METABOLIC PANEL
ALT: 15 U/L (ref 0–44)
AST: 22 U/L (ref 15–41)
Albumin: 3.6 g/dL (ref 3.5–5.0)
Alkaline Phosphatase: 54 U/L (ref 38–126)
Anion gap: 9 (ref 5–15)
BUN: 16 mg/dL (ref 8–23)
CO2: 25 mmol/L (ref 22–32)
Calcium: 8.9 mg/dL (ref 8.9–10.3)
Chloride: 100 mmol/L (ref 98–111)
Creatinine, Ser: 0.79 mg/dL (ref 0.44–1.00)
GFR, Estimated: >60 mL/min (ref >60)
Glucose, Bld: 160 mg/dL — ABNORMAL HIGH (ref 70–99)
Potassium: 3.5 mmol/L (ref 3.5–5.1)
Sodium: 134 mmol/L — ABNORMAL LOW (ref 135–145)
Total Bilirubin: 0.8 mg/dL (ref 0.3–1.2)
Total Protein: 6.5 g/dL (ref 6.5–8.1)

## 2023-01-31 LAB — GLUCOSE, CAPILLARY
Glucose-Capillary: 161 mg/dL — ABNORMAL HIGH (ref 70–99)
Glucose-Capillary: 168 mg/dL — ABNORMAL HIGH (ref 70–99)
Glucose-Capillary: 150 mg/dL — ABNORMAL HIGH (ref 70–99)
Glucose-Capillary: 182 mg/dL — ABNORMAL HIGH (ref 70–99)

## 2023-01-31 LAB — CBC WITH DIFFERENTIAL/PLATELET
Abs Immature Granulocytes: 0.02 10*3/uL (ref 0.00–0.07)
Basophils Absolute: 0 10*3/uL (ref 0.0–0.1)
Basophils Relative: 0 %
Eosinophils Absolute: 0.2 10*3/uL (ref 0.0–0.5)
Eosinophils Relative: 3 %
HCT: 35.5 % — ABNORMAL LOW (ref 36.0–46.0)
Hemoglobin: 11.5 g/dL — ABNORMAL LOW (ref 12.0–15.0)
Immature Granulocytes: 0 %
Lymphocytes Relative: 36 %
Lymphs Abs: 2.5 10*3/uL (ref 0.7–4.0)
MCH: 29 pg (ref 26.0–34.0)
MCHC: 32.4 g/dL (ref 30.0–36.0)
MCV: 89.6 fL (ref 80.0–100.0)
Monocytes Absolute: 0.8 10*3/uL (ref 0.1–1.0)
Monocytes Relative: 11 %
Neutro Abs: 3.4 10*3/uL (ref 1.7–7.7)
Neutrophils Relative %: 50 %
Platelets: 223 10*3/uL (ref 150–400)
RBC: 3.96 MIL/uL (ref 3.87–5.11)
RDW: 13.7 % (ref 11.5–15.5)
WBC: 6.9 10*3/uL (ref 4.0–10.5)
nRBC: 0 % (ref 0.0–0.2)

## 2023-01-31 LAB — FOLATE: Folate: 28.3 ng/mL (ref >5.9)

## 2023-01-31 LAB — HEMOGLOBIN A1C
Hgb A1c MFr Bld: 5.7 % — ABNORMAL HIGH (ref 4.8–5.6)
Mean Plasma Glucose: 116.89 mg/dL

## 2023-01-31 LAB — MAGNESIUM: Magnesium: 1.7 mg/dL (ref 1.7–2.4)

## 2023-01-31 LAB — TSH: TSH: 0.699 u[IU]/mL (ref 0.350–4.500)

## 2023-01-31 LAB — VITAMIN B12: Vitamin B-12: 942 pg/mL — ABNORMAL HIGH (ref 180–914)

## 2023-01-31 MED ORDER — POTASSIUM CHLORIDE 20 MEQ PO PACK
20.0000 meq | PACK | Freq: Once | ORAL | Status: AC
  Administered 2023-01-31: 20 meq via ORAL
  Filled 2023-01-31: qty 1

## 2023-01-31 MED ORDER — INSULIN ASPART 100 UNIT/ML IJ SOLN
0.0000 [IU] | Freq: Three times a day (TID) | INTRAMUSCULAR | Status: DC
  Administered 2023-01-31: 2 [IU] via SUBCUTANEOUS
  Administered 2023-01-31 (×2): 3 [IU] via SUBCUTANEOUS
  Administered 2023-02-01: 2 [IU] via SUBCUTANEOUS
  Administered 2023-02-01: 3 [IU] via SUBCUTANEOUS

## 2023-01-31 MED ORDER — HYDROCHLOROTHIAZIDE 12.5 MG PO TABS
12.5000 mg | ORAL_TABLET | Freq: Every day | ORAL | Status: DC
  Administered 2023-01-31: 12.5 mg via ORAL
  Filled 2023-01-31: qty 1

## 2023-01-31 MED ORDER — GABAPENTIN 300 MG PO CAPS
300.0000 mg | ORAL_CAPSULE | Freq: Every day | ORAL | Status: DC
  Administered 2023-01-31 (×2): 300 mg via ORAL
  Filled 2023-01-31 (×2): qty 1

## 2023-01-31 MED ORDER — ONDANSETRON HCL 4 MG/2ML IJ SOLN: 4.0000 mg | Freq: Four times a day (QID) | INTRAMUSCULAR | Status: DC | PRN

## 2023-01-31 MED ORDER — HEPARIN SODIUM (PORCINE) 5000 UNIT/ML IJ SOLN
5000.0000 [IU] | Freq: Three times a day (TID) | INTRAMUSCULAR | Status: DC
  Administered 2023-01-31 – 2023-02-01 (×4): 5000 [IU] via SUBCUTANEOUS
  Filled 2023-01-31 (×4): qty 1

## 2023-01-31 MED ORDER — MORPHINE SULFATE (PF) 2 MG/ML IV SOLN: 2.0000 mg | INTRAVENOUS | Status: DC | PRN

## 2023-01-31 MED ORDER — LOSARTAN POTASSIUM 25 MG PO TABS
25.0000 mg | ORAL_TABLET | Freq: Every day | ORAL | Status: DC
  Administered 2023-01-31: 25 mg via ORAL
  Filled 2023-01-31: qty 1

## 2023-01-31 MED ORDER — OXYCODONE HCL 5 MG PO TABS
5.0000 mg | ORAL_TABLET | ORAL | Status: DC | PRN
  Administered 2023-01-31 – 2023-02-01 (×7): 5 mg via ORAL
  Filled 2023-01-31 (×7): qty 1

## 2023-01-31 MED ORDER — ACETAMINOPHEN 650 MG RE SUPP: 650.0000 mg | Freq: Four times a day (QID) | RECTAL | Status: DC | PRN

## 2023-01-31 MED ORDER — INSULIN ASPART 100 UNIT/ML IJ SOLN: 0.0000 [IU] | Freq: Every day | INTRAMUSCULAR | Status: DC

## 2023-01-31 MED ORDER — LISINOPRIL 10 MG PO TABS: 20.0000 mg | ORAL_TABLET | Freq: Every day | ORAL | Status: DC

## 2023-01-31 MED ORDER — ACETAMINOPHEN 325 MG PO TABS
650.0000 mg | ORAL_TABLET | Freq: Four times a day (QID) | ORAL | Status: DC | PRN
  Administered 2023-01-31: 650 mg via ORAL

## 2023-01-31 MED ORDER — ONDANSETRON HCL 4 MG PO TABS: 4.0000 mg | ORAL_TABLET | Freq: Four times a day (QID) | ORAL | Status: DC | PRN

## 2023-01-31 NOTE — Plan of Care (Signed)
  Problem: Acute Rehab PT Goals(only PT should resolve) Goal: Pt Will Go Supine/Side To Sit Outcome: Progressing Flowsheets (Taken 01/31/2023 1402) Pt will go Supine/Side to Sit:  with min guard assist  with minimal assist Goal: Patient Will Transfer Sit To/From Stand Outcome: Progressing Flowsheets (Taken 01/31/2023 1402) Patient will transfer sit to/from stand:  with minimal assist  with min guard assist Goal: Pt Will Transfer Bed To Chair/Chair To Bed Outcome: Progressing Flowsheets (Taken 01/31/2023 1402) Pt will Transfer Bed to Chair/Chair to Bed:  with min assist  min guard assist Goal: Pt Will Ambulate Outcome: Progressing Flowsheets (Taken 01/31/2023 1402) Pt will Ambulate:  75 feet  with minimal assist  with min guard assist  with rolling walker   2:03 PM, 01/31/23 Ocie Bob, MPT Physical Therapist with [facility] [phone] office [phone] mobile phone

## 2023-01-31 NOTE — Assessment & Plan Note (Signed)
-   Secondary to compression fracture - Patient unable to ambulate at this time - Pain control pain scale - Continue to monitor

## 2023-01-31 NOTE — TOC Initial Note (Signed)
Transition of Care [facility]) - Initial/Assessment Note    Patient Details  Name: Vanessa Aguilar MRN: [id number] Date of Birth: [birth year]  Transition of Care [facility]) CM/SW Contact:    Annice Needy, LCSW Phone Number: 01/31/2023, 11:58 AM  Clinical Narrative:                 Patient from home alone. Independent at baseline. Admitted after fall which caused intractable back pain. PT recommends SNF. Patient is agreeable. Patient and daughter requested discussed SNFs and referrals sent to requested facilities.   Expected Discharge Plan: Skilled Nursing Facility Barriers to Discharge: Continued Medical Work up   Patient Goals and CMS Choice Patient states their goals for this hospitalization and ongoing recovery are:: rehab then home   Choice offered to / list presented to : Patient, Adult Children      Expected Discharge Plan and Services     Post Acute Care Choice: Skilled Nursing Facility Living arrangements for the past 2 months: Single Family Home                                      Prior Living Arrangements/Services Living arrangements for the past 2 months: Single Family Home Lives with:: Self Patient language and need for interpreter reviewed:: Yes        Need for Family Participation in Patient Care: Yes (Comment) Care giver support system in place?: Yes (comment) Current home services: DME Criminal Activity/Legal Involvement Pertinent to Current Situation/Hospitalization: No - Comment as needed  Activities of Daily Living Home Assistive Devices/Equipment: Walker (specify type) ADL Screening (condition at time of admission) Patient's cognitive ability adequate to safely complete daily activities?: Yes Is the patient deaf or have difficulty hearing?: No Does the patient have difficulty seeing, even when wearing glasses/contacts?: No Does the patient have difficulty concentrating, remembering, or making decisions?: No Patient able to express need for  assistance with ADLs?: Yes Does the patient have difficulty dressing or bathing?: No Independently performs ADLs?: Yes (appropriate for developmental age) Does the patient have difficulty walking or climbing stairs?: Yes Weakness of Legs: Both Weakness of Arms/Hands: None  Permission Sought/Granted Permission sought to share information with : Family Supports    Share Information with NAME: daughter was at bedside           Emotional Assessment     Affect (typically observed): Appropriate Orientation: : Oriented to Self, Oriented to Place, Oriented to  Time, Oriented to Situation Alcohol / Substance Use: Not Applicable Psych Involvement: No (comment)  Admission diagnosis:  Intractable back pain [M54.9] Closed compression fracture of L3 lumbar vertebra, initial encounter (HCC) [S32.030A] Patient Active Problem List   Diagnosis Date Noted   Compression fracture of L3 lumbar vertebra, closed, initial encounter (HCC) 01/31/2023   Generalized weakness 01/31/2023   Hypokalemia 01/31/2023   Intractable back pain 01/30/2023   Osteoarthritis of knee    Essential (primary) hypertension    Hyperlipidemia, unspecified    Type 2 diabetes mellitus with hyperglycemia (HCC)    Chronic kidney disease, stage 2 (mild)    Hypertensive chronic kidney disease with stage 1 through stage 4 chronic kidney disease, or unspecified chronic kidney disease    Obesity, unspecified    Type 2 diabetes mellitus with other diabetic kidney complication (HCC)    Unspecified osteoarthritis, unspecified site    KNEE, ARTHRITIS, DEGEN./OSTEO 06/04/2008   PCP:  Benita Stabile, MD  Pharmacy:   [facility] [place], [place] - [address] [place] [address] Phone: [phone] Fax: [phone]  [facility] [address], [place] - [address] [place] [address] [place] [address] [place] [address] Phone: [phone] Fax: [phone]     Social Determinants of Health  (SDOH) Social History: SDOH Screenings   Food Insecurity: No Food Insecurity (01/31/2023)  Housing: Low Risk  (01/31/2023)  Transportation Needs: No Transportation Needs (01/31/2023)  Utilities: Not At Risk (01/31/2023)  Depression (PHQ2-9): Low Risk  (12/23/2019)  Tobacco Use: Low Risk  (01/30/2023)   SDOH Interventions:     Readmission Risk Interventions     No data to display

## 2023-01-31 NOTE — Assessment & Plan Note (Signed)
   Continue lisinopril and hydrochlorothiazide 

## 2023-01-31 NOTE — Progress Notes (Signed)
   Patient seen and examined at bedside, patient admitted after midnight, please see earlier detailed admission note by [place] B Zierle-Ghosh, DO. Briefly, patient presented secondary to fall and subsequent back pain, found to have evidence of an L3 lumbar compression fracture. Patient admitted for pain control.  Subjective: Patient reports her pain is improved this morning. She has not gotten up yet.  BP 119/61 (BP Location: Right Arm)   Pulse 87   Temp 98.3 F (36.8 C) (Oral)   Resp 19   Ht 5\' 4"  (1.626 m)   Wt 60.7 kg   SpO2 96%   BMI 22.97 kg/m   General exam: Appears calm and comfortable Respiratory system: Clear to auscultation. Respiratory effort normal. Cardiovascular system: S1 & S2 heard, RRR. No murmurs, rubs, gallops or clicks. Gastrointestinal system: Abdomen is nondistended, soft and nontender. No organomegaly or masses felt. Normal bowel sounds heard. Central nervous system: Alert and oriented. No focal neurological deficits. Musculoskeletal: No edema. No calf tenderness Skin: No cyanosis. No rashes Psychiatry: Judgement and insight appear normal. Mood & affect appropriate.   Brief assessment/Plan:  Intractable back pain Secondary to compression fracture which is secondary to fall. Pain affect ability to ambulate. -Continue oxycodone and morphine PRN -PT/OT evaluation -Continue TLSO brace with ambulation -Outpatient neurosurgery follow-up  Generalized weakness Chronic and progressive. -PT/OT evaluation  Compression fracture Diabetes mellitus type 2 Primary hypertension -Per H&P  Family communication: Daughter at bedside DVT prophylaxis: Heparin subq Disposition: Discharge likely home vs SNF in 24 hours if pain is controlled  Jacquelin Hawking, MD [facility] 01/31/2023, 6:42 AM

## 2023-01-31 NOTE — Assessment & Plan Note (Signed)
-   Due to poor p.o. intake - Replace and recheck

## 2023-01-31 NOTE — Assessment & Plan Note (Signed)
-   Ongoing for some time - Daughter reports poor p.o. intake - Check for nutritional deficiencies that could be contributing - Check TSH - PT eval and treat - Continue to monitor

## 2023-01-31 NOTE — Assessment & Plan Note (Signed)
-   Secondary to fall - Brace in place - No acute neuro changes - Pain control with pain scale - PT/OT eval and treat - Continue to monitor

## 2023-01-31 NOTE — Assessment & Plan Note (Addendum)
-   Hold metformin - Sliding scale coverage - Continue to monitor 

## 2023-01-31 NOTE — NC FL2 (Signed)
[place] MEDICAID FL2 LEVEL OF CARE FORM     IDENTIFICATION  Patient Name: Vanessa Aguilar Birthdate: October 23, 1940 Sex: female Admission Date (Current Location): 01/30/2023  [facility] and [place] Number:  [employer] and Address:  [facility],  [address], [place] [address]      Provider Number: [id number]  Attending Physician Name and Address:  Narda Bonds, MD  Relative Name and Phone Number:  Levell July (Granddaughter) [phone]    Current Level of Care: Hospital (observation) Recommended Level of Care: Skilled Nursing Facility Prior Approval Number:    Date Approved/Denied:   PASRR Number: [id number]  Discharge Plan: SNF    Current Diagnoses: Patient Active Problem List   Diagnosis Date Noted   Compression fracture of L3 lumbar vertebra, closed, initial encounter (HCC) 01/31/2023   Generalized weakness 01/31/2023   Hypokalemia 01/31/2023   Intractable back pain 01/30/2023   Osteoarthritis of knee    Essential (primary) hypertension    Hyperlipidemia, unspecified    Type 2 diabetes mellitus with hyperglycemia (HCC)    Chronic kidney disease, stage 2 (mild)    Hypertensive chronic kidney disease with stage 1 through stage 4 chronic kidney disease, or unspecified chronic kidney disease    Obesity, unspecified    Type 2 diabetes mellitus with other diabetic kidney complication (HCC)    Unspecified osteoarthritis, unspecified site    KNEE, ARTHRITIS, DEGEN./OSTEO 06/04/2008    Orientation RESPIRATION BLADDER Height & Weight     Self, Time, Situation, Place  Normal Continent Weight: 133 lb 13.1 oz (60.7 kg) Height:  5\' 4"  (162.6 cm)  BEHAVIORAL SYMPTOMS/MOOD NEUROLOGICAL BOWEL NUTRITION STATUS      Continent Diet (heart healthy)  AMBULATORY STATUS COMMUNICATION OF NEEDS Skin   Limited Assist Verbally Normal                       Personal Care Assistance Level of Assistance  Bathing, Dressing, Feeding Bathing  Assistance: Limited assistance Feeding assistance: Independent Dressing Assistance: Limited assistance     Functional Limitations Info  Sight, Hearing, Speech Sight Info: Adequate Hearing Info: Adequate Speech Info: Adequate    SPECIAL CARE FACTORS FREQUENCY  PT (By licensed PT), OT (By licensed OT)     PT Frequency: 5x/week OT Frequency: 3x/week            Contractures Contractures Info: Not present    Additional Factors Info  Code Status, Allergies Code Status Info: DNR Allergies Info: NKA           Current Medications (01/31/2023):  This is the current hospital active medication list Current Facility-Administered Medications  Medication Dose Route Frequency Provider Last Rate Last Admin   acetaminophen (TYLENOL) tablet 650 mg  650 mg Oral Q6H PRN Zierle-Ghosh, Asia B, DO   650 mg at 01/31/23 0936   Or   acetaminophen (TYLENOL) suppository 650 mg  650 mg Rectal Q6H PRN Zierle-Ghosh, Asia B, DO       gabapentin (NEURONTIN) capsule 300 mg  300 mg Oral QHS Zierle-Ghosh, Asia B, DO   300 mg at 01/31/23 0015   heparin injection 5,000 Units  5,000 Units Subcutaneous Q8H Zierle-Ghosh, Asia B, DO   5,000 Units at 01/31/23 0508   hydrochlorothiazide (HYDRODIURIL) tablet 12.5 mg  12.5 mg Oral Daily Zierle-Ghosh, Asia B, DO   12.5 mg at 01/31/23 0936   insulin aspart (novoLOG) injection 0-15 Units  0-15 Units Subcutaneous TID WC Zierle-Ghosh, Asia B, DO   3  Units at 01/31/23 1610   insulin aspart (novoLOG) injection 0-5 Units  0-5 Units Subcutaneous QHS Zierle-Ghosh, Asia B, DO       losartan (COZAAR) tablet 25 mg  25 mg Oral Daily Narda Bonds, MD       morphine (PF) 2 MG/ML injection 2 mg  2 mg Intravenous Q2H PRN Zierle-Ghosh, Asia B, DO       ondansetron (ZOFRAN) tablet 4 mg  4 mg Oral Q6H PRN Zierle-Ghosh, Asia B, DO       Or   ondansetron (ZOFRAN) injection 4 mg  4 mg Intravenous Q6H PRN Zierle-Ghosh, Asia B, DO       oxyCODONE (Oxy IR/ROXICODONE) immediate release  tablet 5 mg  5 mg Oral Q4H PRN Zierle-Ghosh, Asia B, DO   5 mg at 01/31/23 0510     Discharge Medications: Please see discharge summary for a list of discharge medications.  Relevant Imaging Results:  Relevant Lab Results:   Additional Information SSN [id number] Juleen China, LCSW

## 2023-01-31 NOTE — H&P (Signed)
History and Physical    Patient: Vanessa Aguilar:[id number] DOB: 07/04/1941 DOA: 01/30/2023 DOS: the patient was seen and examined on 01/31/2023 PCP: Benita Stabile, MD  Patient coming from: Home  Chief Complaint:  Chief Complaint  Patient presents with   Fall   HPI: Vanessa Aguilar is a 82 y.o. female with medical history significant of CKD, diabetes mellitus type 2, hypertension, hyperlipidemia, osteoarthritis, who lives alone and presents the hospital today secondary to difficulty ambulating and back pain.  Patient had a fall 5 days ago.  She reports she was turning around in her closet when she lost her footing and fell backwards.  She is not sure if she hit her head but she does not think that she did.  She reports no loss of consciousness.  She was not able to get up on her own and had to call her neighbor to pick her up.  She went to church that evening like a normal day.  She felt a little sore, but it was manageable.  The pain has gotten worse since then.  Patient has been able to ambulate less and less since then.  She has not been eating because she does not want to get up to have to go to the bathroom.  She does use a walker at home when she has arthritis flares but she has not even been able to get around with that recently.  Daughter at bedside reports that patient has had weakness in her arms and legs for quite some time.  This is her second fall.  Family is concerned about patient ambulating in her home by herself.  Patient has no other complaints at this time.  Patient does not smoke, does not drink, does not use illicit drugs.  She is vaccinated.  Patient is DNR.  She reports she has a living will and has been advised to bring in any ACP documents for [place] to scanned into the chart. Review of Systems: As mentioned in the history of present illness. All other systems reviewed and are negative. Past Medical History:  Diagnosis Date   Chronic kidney disease, stage 2 (mild)     Diabetes mellitus without complication (HCC)    DM (diabetes mellitus), type 2 (HCC)    Essential (primary) hypertension    Hyperlipidemia, unspecified    Hypertension    Hypertensive chronic kidney disease with stage 1 through stage 4 chronic kidney disease, or unspecified chronic kidney disease    Obesity, unspecified    Osteoarthritis of knee    Type 2 diabetes mellitus with hyperglycemia (HCC)    Type 2 diabetes mellitus with other diabetic kidney complication (HCC)    Unspecified osteoarthritis, unspecified site    History reviewed. No pertinent surgical history. Social History:  reports that she has never smoked. She has never been exposed to tobacco smoke. She has never used smokeless tobacco. She reports that she does not currently use alcohol. She reports that she does not currently use drugs.  No Known Allergies  Family History  Family history unknown: Yes    Prior to Admission medications   Medication Sig Start Date End Date Taking? Authorizing Provider  gabapentin (NEURONTIN) 300 MG capsule Take 300 mg by mouth at bedtime.    [provider]  gabapentin (NEURONTIN) 300 MG capsule Take 300 mg by mouth 3 (three) times daily.    [provider]  glucose blood test strip Use as instructed 02/10/20   Corum, Minerva Fester, MD  hydrochlorothiazide (MICROZIDE) 12.5 MG capsule Take 12.5 mg by mouth daily. 05/09/19   [provider]  hydrochlorothiazide (MICROZIDE) 12.5 MG capsule Take 12.5 mg by mouth daily.    [provider]  Lancets Letta Pate ULTRASOFT) lancets Use as instructed 02/10/20   Wandra Feinstein, MD  lisinopril (ZESTRIL) 20 MG tablet Take 20 mg by mouth daily.    [provider]  lisinopril (ZESTRIL) 20 MG tablet Take 20 mg by mouth daily.    [provider]  metFORMIN (GLUCOPHAGE) 500 MG tablet Take 1 tablet (500 mg total) by mouth 2 (two) times daily. 03/09/20   Corum, Minerva Fester, MD  metFORMIN (GLUCOPHAGE) 500 MG tablet Take by  mouth 2 (two) times daily with a meal.    [provider]    Physical Exam: Vitals:   01/30/23 1500 01/30/23 1501 01/30/23 2206 01/31/23 0000  BP: (!) 151/68  (!) 166/60 (!) 136/58  Pulse: (!) 104  81 78  Resp: 18  20 18   Temp: 98.1 F (36.7 C)  98 F (36.7 C) 98.8 F (37.1 C)  TempSrc:   Oral Oral  SpO2: 96%  95% 94%  Weight:  64.9 kg  60.7 kg  Height:  5\' 4"  (1.626 m)     1.  General: Patient lying supine in bed,  no acute distress   2. Psychiatric: Alert and oriented x 3, mood and behavior normal for situation, pleasant and cooperative with exam   3. Neurologic: Speech and language are normal, face is symmetric, moves all 4 extremities voluntarily, no asymmetric weakness but the lower extremities are weak bilaterally   4. HEENMT:  Head is atraumatic, normocephalic, pupils reactive to light, neck is supple, trachea is midline, mucous membranes are moist   5. Respiratory : Lungs are clear to auscultation bilaterally without wheezing, rhonchi, rales, no cyanosis, no increase in work of breathing or accessory muscle use   6. Cardiovascular : Heart rate normal, rhythm is regular, no murmurs, rubs or gallops, no peripheral edema, peripheral pulses palpated   7. Gastrointestinal:  Abdomen is soft, nondistended, nontender to palpation bowel sounds active, no masses or organomegaly palpated   8. Skin:  Skin is warm, dry and intact without rashes, acute lesions, or ulcers on limited exam   9.Musculoskeletal:  No acute deformities or trauma, no asymmetry in tone, no peripheral edema, peripheral pulses palpated, no tenderness to palpation in the extremities  Data Reviewed: In the ED Temp 98.1, heart rate 81-104, respiratory rate 18-20, blood pressure 151/60-166/68, satting 95% No leukocytosis with a white blood cell count of 7.6, hemoglobin stable at 12.2 Chemistries unremarkable CT shows a L3 acute compression fracture 50% vertebral height loss Patient was given a  couple doses of pain medication She was not able to get up and ambulate on her own Admission was requested for generalized weakness, intractable back pain, likely needing PT and OT eval  Assessment and Plan: * Intractable back pain - Secondary to compression fracture - Patient unable to ambulate at this time - Pain control pain scale - Continue to monitor  Generalized weakness - Ongoing for some time - Daughter reports poor p.o. intake - Check for nutritional deficiencies that could be contributing - Check TSH - PT eval and treat - Continue to monitor  Compression fracture of L3 lumbar vertebra, closed, initial encounter (HCC) - Secondary to fall - Brace in place - No acute neuro changes - Pain control with pain scale - PT/OT eval and treat - Continue  to monitor  Type 2 diabetes mellitus with other diabetic kidney complication (HCC) - Hold metformin - Sliding scale coverage - Continue to monitor  Essential (primary) hypertension - Continue lisinopril and hydrochlorothiazide      Advance Care Planning:   Code Status: DNR  Consults: Will likely need neurosurgery outpatient follow-up  Family Communication: Daughter at bedside  Severity of Illness: The appropriate patient status for this patient is OBSERVATION. Observation status is judged to be reasonable and necessary in order to provide the required intensity of service to ensure the patient's safety. The patient's presenting symptoms, physical exam findings, and initial radiographic and laboratory data in the context of their medical condition is felt to place them at decreased risk for further clinical deterioration. Furthermore, it is anticipated that the patient will be medically stable for discharge from the hospital within 2 midnights of admission.   Author: Lilyan Gilford, DO 01/31/2023 4:10 AM  For on call review [email or web address].

## 2023-01-31 NOTE — Evaluation (Addendum)
Occupational Therapy Evaluation Patient Details Name: Vanessa Aguilar MRN: [id number] DOB: 24-Feb-1941 Today's Date: 01/31/2023   History of Present Illness Vanessa Aguilar is a 82 y.o. female with medical history significant of CKD, diabetes mellitus type 2, hypertension, hyperlipidemia, osteoarthritis, who lives alone and presents the hospital today secondary to difficulty ambulating and back pain.  Patient had a fall 5 days ago.  She reports she was turning around in her closet when she lost her footing and fell backwards.  She is not sure if she hit her head but she does not think that she did.  She reports no loss of consciousness.  She was not able to get up on her own and had to call her neighbor to pick her up.  She went to church that evening like a normal day.  She felt a little sore, but it was manageable.  The pain has gotten worse since then.  Patient has been able to ambulate less and less since then.  She has not been eating because she does not want to get up to have to go to the bathroom.  She does use a walker at home when she has arthritis flares but she has not even been able to get around with that recently.  Daughter at bedside reports that patient has had weakness in her arms and legs for quite some time.  This is her second fall.  Family is concerned about patient ambulating in her home by herself.  Patient has no other complaints at this time. (per DO)   Clinical Impression   Pt agreeable to OT and PT co-evaluation. Pt independent at baseline. Pt donning TLSO brace at start of session. Pt required mod A for bed mobility and min to mod A for ambulation/transfers. B UE generally weak with much assist needed for lower body dressing tasks due to limited trunk flexion and weakness. Pt noted to have trouble controlling urination today requiring assist to clean and change herself. Pt is a high fall risk at this time. Pt was left in the chair with call bell within reach and family present.  Pt will benefit from continued OT in the hospital and recommended venue below to increase strength, balance, and endurance for safe ADL's.        Recommendations for follow up therapy are one component of a multi-disciplinary discharge planning process, led by the attending physician.  Recommendations may be updated based on patient status, additional functional criteria and insurance authorization.   Assistance Recommended at Discharge Intermittent Supervision/Assistance  Patient can return home with the following A lot of help with walking and/or transfers;A lot of help with bathing/dressing/bathroom;Assistance with cooking/housework;Assist for transportation;Help with stairs or ramp for entrance    Functional Status Assessment  Patient has had a recent decline in their functional status and demonstrates the ability to make significant improvements in function in a reasonable and predictable amount of time.  Equipment Recommendations  None recommended by OT           Precautions / Restrictions Precautions Precautions: Fall Precaution Comments: TLSO brace Spinal Brace: Thoracolumbosacral orthotic Restrictions Weight Bearing Restrictions: No      Mobility Bed Mobility Overal bed mobility: Needs Assistance Bed Mobility: Supine to Sit     Supine to sit: Mod assist     General bed mobility comments: PT educated pt on log roll technique and provided mod A.    Transfers Overall transfer level: Needs assistance Equipment used: Rolling walker (2 wheels) Transfers:  Sit to/from Stand, Bed to chair/wheelchair/BSC Sit to Stand: Min assist, Mod assist     Step pivot transfers: Min assist, Mod assist     General transfer comment: Labored effort; extended time.      Balance Overall balance assessment: Needs assistance Sitting-balance support: No upper extremity supported, Feet supported Sitting balance-Leahy Scale: Fair Sitting balance - Comments: fair to good at EOB    Standing balance support: Bilateral upper extremity supported, During functional activity, Reliant on assistive device for balance Standing balance-Leahy Scale: Fair Standing balance comment: using RW                           ADL either performed or assessed with clinical judgement   ADL Overall ADL's : Needs assistance/impaired     Grooming: Set up;Sitting       Lower Body Bathing: Maximal assistance;Moderate assistance;Sitting/lateral leans   Upper Body Dressing : Set up;Sitting   Lower Body Dressing: Maximal assistance;Sitting/lateral leans Lower Body Dressing Details (indicate cue type and reason): Pt unable to reach B LE at EOB to don socks. Toilet Transfer: Minimal assistance;Moderate assistance;Rolling walker (2 wheels) Toilet Transfer Details (indicate cue type and reason): Simulated via EOB to chair transfer. Toileting- Clothing Manipulation and Hygiene: Sitting/lateral lean;Moderate assistance Toileting - Clothing Manipulation Details (indicate cue type and reason): Pt urinated on the floor during session. Assist to clean legs.     Functional mobility during ADLs: Moderate assistance;Minimal assistance General ADL Comments: Pt ambualted over 15 feet in the hall and room with RW.     Vision Baseline Vision/History: 1 Wears glasses Ability to See in Adequate Light: 1 Impaired Patient Visual Report: No change from baseline Vision Assessment?: No apparent visual deficits                Pertinent Vitals/Pain Pain Assessment Pain Assessment: No/denies pain     Hand Dominance Left   Extremity/Trunk Assessment Upper Extremity Assessment Upper Extremity Assessment: Generalized weakness (limited to ~75% of available shoulder flexion A/ROM bilaterally seated at EOB.)   Lower Extremity Assessment Lower Extremity Assessment: Defer to PT evaluation   Cervical / Trunk Assessment Cervical / Trunk Assessment: Normal   Communication  Communication Communication: HOH   Cognition Arousal/Alertness: Awake/alert Behavior During Therapy: WFL for tasks assessed/performed Overall Cognitive Status: Within Functional Limits for tasks assessed                                                        Home Living Family/patient expects to be discharged to:: Private residence Living Arrangements: Alone Available Help at Discharge: Family;Available PRN/intermittently Type of Home: House Home Access: Stairs to enter [employer] of Steps: 3 Entrance Stairs-Rails: Right (going up) Home Layout: One level     Bathroom Shower/Tub: Producer, television/film/video: Handicapped height (Toilet riser) Bathroom Accessibility: No   Home Equipment: Agricultural consultant (2 wheels);BSC/3in1          Prior Functioning/Environment Prior Level of Function : Needs assist       Physical Assist : ADLs (physical)   ADLs (physical): IADLs Mobility Comments: [place]ous[place] ambulator leaning on walls within the home; RW used when it rains. [employer] PRN as well ADLs Comments: Independent ADL; able to cook and clean; assisted for groceries.  OT Problem List: Decreased strength;Decreased range of motion;Decreased activity tolerance;Impaired balance (sitting and/or standing)      OT Treatment/Interventions: Self-care/ADL training;Therapeutic exercise;Therapeutic activities;Patient/family education;Balance training;DME and/or AE instruction    OT Goals(Current goals can be found in the care plan section) Acute Rehab OT Goals Patient Stated Goal: Get stonger at rehab. OT Goal Formulation: With patient/family Time For Goal Achievement: 02/14/23 Potential to Achieve Goals: Good  OT Frequency: Min 2X/week    Co-evaluation PT/OT/SLP Co-Evaluation/Treatment: Yes Reason for Co-Treatment: To address functional/ADL transfers   OT goals addressed during session: ADL's and self-care      AM-PAC OT "6 Clicks"  Daily Activity     Outcome Measure Help from another person eating meals?: None Help from another person taking care of personal grooming?: A Little Help from another person toileting, which includes using toliet, bedpan, or urinal?: A Lot Help from another person bathing (including washing, rinsing, drying)?: A Lot Help from another person to put on and taking off regular upper body clothing?: A Little Help from another person to put on and taking off regular lower body clothing?: A Lot 6 Click Score: 16   End of Session Equipment Utilized During Treatment: Rolling walker (2 wheels);Gait belt  Activity Tolerance: Patient tolerated treatment well Patient left: in chair;with call bell/phone within reach;with family/visitor present  OT Visit Diagnosis: Unsteadiness on feet (R26.81);Other abnormalities of gait and mobility (R26.89);History of falling (Z91.81);Muscle weakness (generalized) (M62.81)                Time: 1610-9604 OT Time Calculation (min): 26 min Charges:  OT General Charges $OT Visit: 1 Visit OT Evaluation $OT Eval Low Complexity: 1 Low  Adeel Guiffre OT, MOT  Danie Chandler 01/31/2023, 9:44 AM

## 2023-01-31 NOTE — Evaluation (Signed)
Physical Therapy Evaluation Patient Details Name: Vanessa Aguilar MRN: [id number] DOB: 1941-08-12 Today's Date: 01/31/2023  History of Present Illness  Vanessa Aguilar is a 82 y.o. female with medical history significant of CKD, diabetes mellitus type 2, hypertension, hyperlipidemia, osteoarthritis, who lives alone and presents the hospital today secondary to difficulty ambulating and back pain.  Patient had a fall 5 days ago.  She reports she was turning around in her closet when she lost her footing and fell backwards.  She is not sure if she hit her head but she does not think that she did.  She reports no loss of consciousness.  She was not able to get up on her own and had to call her neighbor to pick her up.  She went to church that evening like a normal day.  She felt a little sore, but it was manageable.  The pain has gotten worse since then.  Patient has been able to ambulate less and less since then.  She has not been eating because she does not want to get up to have to go to the bathroom.  She does use a walker at home when she has arthritis flares but she has not even been able to get around with that recently.  Daughter at bedside reports that patient has had weakness in her arms and legs for quite some time.  This is her second fall.  Family is concerned about patient ambulating in her home by herself.  Patient has no other complaints at this time.   Clinical Impression  Patient had difficulty log rolling to side due to stiffness in knees which also limits her ability to sit on low commodes/chairs, unable to complete sit to stands without AD due to BLE weakness and required the use of a RW for safety.  Patient incontinent of urine upon standing and demonstrates slow labored cadence during ambulation and limited mostly due to c/o fatigue/generalized weakness.  Patient tolerated sitting up in chair with her daughter present after therapy - nursing staff aware.  Patient will benefit from  continued skilled physical therapy in hospital and recommended venue below to increase strength, balance, endurance for safe ADLs and gait.         Recommendations for follow up therapy are one component of a multi-disciplinary discharge planning process, led by the attending physician.  Recommendations may be updated based on patient status, additional functional criteria and insurance authorization.  Follow Up Recommendations Can patient physically be transported by private vehicle: Yes     Assistance Recommended at Discharge Set up Supervision/Assistance  Patient can return home with the following  A lot of help with walking and/or transfers;A lot of help with bathing/dressing/bathroom;Help with stairs or ramp for entrance;Assistance with cooking/housework    Equipment Recommendations None recommended by PT  Recommendations for Other Services       Functional Status Assessment Patient has had a recent decline in their functional status and demonstrates the ability to make significant improvements in function in a reasonable and predictable amount of time.     Precautions / Restrictions Precautions Precautions: Fall Precaution Comments: TLSO brace Spinal Brace: Thoracolumbosacral orthotic Restrictions Weight Bearing Restrictions: No      Mobility  Bed Mobility Overal bed mobility: Needs Assistance Bed Mobility: Rolling, Sidelying to Sit Rolling: Min assist Sidelying to sit: Mod assist       General bed mobility comments: had diffiuclty flexing left knee due to stiffness when rolling to side and required Mod  assist for side lying to sitting due to weakness    Transfers Overall transfer level: Needs assistance Equipment used: Rolling walker (2 wheels) Transfers: Sit to/from Stand, Bed to chair/wheelchair/BSC Sit to Stand: Min assist, Mod assist   Step pivot transfers: Min assist, Mod assist       General transfer comment: increased time, labored movement, unable  to transfer without AD, required use of RW for safety    Ambulation/Gait Ambulation/Gait assistance: Min assist, Mod assist Gait Distance (Feet): 35 Feet Assistive device: Rolling walker (2 wheels) Gait Pattern/deviations: Decreased step length - right, Decreased step length - left, Decreased stride length, Drifts right/left Gait velocity: slow     General Gait Details: slow labored unsteady cadence with occasional veering left/right and limited mostly due to fatigue and decreased balance  Stairs            Wheelchair Mobility    Modified Rankin (Stroke Patients Only)       Balance Overall balance assessment: Needs assistance Sitting-balance support: Feet supported, No upper extremity supported Sitting balance-Leahy Scale: Fair Sitting balance - Comments: fair/good seated at EOB   Standing balance support: Reliant on assistive device for balance, During functional activity, No upper extremity supported Standing balance-Leahy Scale: Poor Standing balance comment: fair using RW                             Pertinent Vitals/Pain Pain Assessment Pain Assessment: No/denies pain    Home Living Family/patient expects to be discharged to:: Private residence Living Arrangements: Alone Available Help at Discharge: Family;Available PRN/intermittently Type of Home: House Home Access: Stairs to enter Entrance Stairs-Rails: Right Entrance Stairs-Number of Steps: 3   Home Layout: One level Home Equipment: Agricultural consultant (2 wheels);BSC/3in1;Other (comment)      Prior Function Prior Level of Function : Needs assist       Physical Assist : ADLs (physical);Mobility (physical) Mobility (physical): Bed mobility;Transfers;Gait;Stairs ADLs (physical): IADLs Mobility Comments: [place] ambulator leaning on walls within the home; RW used when it rains. Hurrycane also PRN. ADLs Comments: Independent ADL; able to cook and clean; assisted for groceries.     Hand  Dominance   Dominant Hand: Left    Extremity/Trunk Assessment   Upper Extremity Assessment Upper Extremity Assessment: Defer to OT evaluation    Lower Extremity Assessment Lower Extremity Assessment: Generalized weakness    Cervical / Trunk Assessment Cervical / Trunk Assessment: Normal  Communication   Communication: HOH  Cognition Arousal/Alertness: Awake/alert Behavior During Therapy: WFL for tasks assessed/performed Overall Cognitive Status: Within Functional Limits for tasks assessed                                          General Comments      Exercises     Assessment/Plan    PT Assessment Patient needs continued PT services  PT Problem List Decreased strength;Decreased activity tolerance;Decreased balance;Decreased mobility       PT Treatment Interventions DME instruction;Gait training;Stair training;Functional mobility training;Therapeutic activities;Therapeutic exercise;Balance training;Patient/family education    PT Goals (Current goals can be found in the Care Plan section)  Acute Rehab PT Goals Patient Stated Goal: return home after rehab PT Goal Formulation: With patient/family Time For Goal Achievement: 02/14/23 Potential to Achieve Goals: Good    Frequency Min 3X/week     Co-evaluation PT/OT/SLP Co-Evaluation/Treatment: Yes Reason for  Co-Treatment: To address functional/ADL transfers PT goals addressed during session: Mobility/safety with mobility;Balance;Proper use of DME         AM-PAC PT "6 Clicks" Mobility  Outcome Measure Help needed turning from your back to your side while in a flat bed without using bedrails?: A Little Help needed moving from lying on your back to sitting on the side of a flat bed without using bedrails?: A Lot Help needed moving to and from a bed to a chair (including a wheelchair)?: A Little Help needed standing up from a chair using your arms (e.g., wheelchair or bedside chair)?: A Little Help  needed to walk in hospital room?: A Lot Help needed climbing 3-5 steps with a railing? : A Lot 6 Click Score: 15    End of Session   Activity Tolerance: Patient tolerated treatment well;Patient limited by fatigue Patient left: in chair;with call bell/phone within reach;with family/visitor present Nurse Communication: Mobility status PT Visit Diagnosis: Unsteadiness on feet (R26.81);Other abnormalities of gait and mobility (R26.89);Muscle weakness (generalized) (M62.81)    Time: 2956-2130 PT Time Calculation (min) (ACUTE ONLY): 30 min   Charges:   PT Evaluation $PT Eval Moderate Complexity: 1 Mod PT Treatments $Therapeutic Activity: 23-37 mins        2:01 PM, 01/31/23 Ocie Bob, MPT Physical Therapist with [facility] [phone] office [phone] mobile phone

## 2023-01-31 NOTE — Plan of Care (Signed)
  Problem: Acute Rehab OT Goals (only OT should resolve) Goal: Pt. Will Perform Grooming Flowsheets (Taken 01/31/2023 0947) Pt Will Perform Grooming:  with modified independence  standing Goal: Pt. Will Perform Lower Body Bathing Flowsheets (Taken 01/31/2023 0947) Pt Will Perform Lower Body Bathing:  with modified independence  sitting/lateral leans  with adaptive equipment Goal: Pt. Will Perform Lower Body Dressing Flowsheets (Taken 01/31/2023 0947) Pt Will Perform Lower Body Dressing:  with modified independence  with adaptive equipment  sitting/lateral leans Goal: Pt. Will Transfer To Toilet Flowsheets (Taken 01/31/2023 [phone]) Pt Will Transfer to Toilet:  with modified independence  ambulating Goal: Pt. Will Perform Toileting-Clothing Manipulation Flowsheets (Taken 01/31/2023 0947) Pt Will Perform Toileting - Clothing Manipulation and hygiene:  with modified independence  sitting/lateral leans Goal: Pt/Caregiver Will Perform Home Exercise Program Flowsheets (Taken 01/31/2023 [phone]) Pt/caregiver will Perform Home Exercise Program:  Increased ROM  Increased strength  Both right and left upper extremity  Independently  Tylor Gambrill OT, MOT

## 2023-02-01 DIAGNOSIS — R279 Unspecified lack of coordination: Secondary | ICD-10-CM | POA: Diagnosis not present

## 2023-02-01 DIAGNOSIS — M6281 Muscle weakness (generalized): Secondary | ICD-10-CM | POA: Diagnosis not present

## 2023-02-01 DIAGNOSIS — I10 Essential (primary) hypertension: Secondary | ICD-10-CM | POA: Diagnosis not present

## 2023-02-01 DIAGNOSIS — M62521 Muscle wasting and atrophy, not elsewhere classified, right upper arm: Secondary | ICD-10-CM | POA: Diagnosis not present

## 2023-02-01 DIAGNOSIS — M62561 Muscle wasting and atrophy, not elsewhere classified, right lower leg: Secondary | ICD-10-CM | POA: Diagnosis not present

## 2023-02-01 DIAGNOSIS — E876 Hypokalemia: Secondary | ICD-10-CM | POA: Diagnosis not present

## 2023-02-01 DIAGNOSIS — M62562 Muscle wasting and atrophy, not elsewhere classified, left lower leg: Secondary | ICD-10-CM | POA: Diagnosis not present

## 2023-02-01 DIAGNOSIS — E1129 Type 2 diabetes mellitus with other diabetic kidney complication: Secondary | ICD-10-CM | POA: Diagnosis not present

## 2023-02-01 DIAGNOSIS — R2689 Other abnormalities of gait and mobility: Secondary | ICD-10-CM | POA: Diagnosis not present

## 2023-02-01 DIAGNOSIS — M549 Dorsalgia, unspecified: Secondary | ICD-10-CM | POA: Diagnosis not present

## 2023-02-01 DIAGNOSIS — Z9181 History of falling: Secondary | ICD-10-CM | POA: Diagnosis not present

## 2023-02-01 DIAGNOSIS — R531 Weakness: Secondary | ICD-10-CM | POA: Diagnosis not present

## 2023-02-01 DIAGNOSIS — M62522 Muscle wasting and atrophy, not elsewhere classified, left upper arm: Secondary | ICD-10-CM | POA: Diagnosis not present

## 2023-02-01 DIAGNOSIS — R41841 Cognitive communication deficit: Secondary | ICD-10-CM | POA: Diagnosis not present

## 2023-02-01 DIAGNOSIS — Z743 Need for continuous supervision: Secondary | ICD-10-CM | POA: Diagnosis not present

## 2023-02-01 DIAGNOSIS — R4789 Other speech disturbances: Secondary | ICD-10-CM | POA: Diagnosis not present

## 2023-02-01 DIAGNOSIS — S32030S Wedge compression fracture of third lumbar vertebra, sequela: Secondary | ICD-10-CM | POA: Diagnosis not present

## 2023-02-01 DIAGNOSIS — E785 Hyperlipidemia, unspecified: Secondary | ICD-10-CM | POA: Diagnosis not present

## 2023-02-01 DIAGNOSIS — E669 Obesity, unspecified: Secondary | ICD-10-CM | POA: Diagnosis not present

## 2023-02-01 DIAGNOSIS — S32030A Wedge compression fracture of third lumbar vertebra, initial encounter for closed fracture: Secondary | ICD-10-CM | POA: Diagnosis not present

## 2023-02-01 DIAGNOSIS — M1990 Unspecified osteoarthritis, unspecified site: Secondary | ICD-10-CM | POA: Diagnosis not present

## 2023-02-01 DIAGNOSIS — S32030D Wedge compression fracture of third lumbar vertebra, subsequent encounter for fracture with routine healing: Secondary | ICD-10-CM | POA: Diagnosis not present

## 2023-02-01 LAB — GLUCOSE, CAPILLARY
Glucose-Capillary: 132 mg/dL — ABNORMAL HIGH (ref 70–99)
Glucose-Capillary: 174 mg/dL — ABNORMAL HIGH (ref 70–99)
Glucose-Capillary: 123 mg/dL — ABNORMAL HIGH (ref 70–99)

## 2023-02-01 MED ORDER — SENNA 8.6 MG PO TABS: 1.0000 | ORAL_TABLET | Freq: Every day | ORAL | Status: DC | PRN

## 2023-02-01 MED ORDER — POLYETHYLENE GLYCOL 3350 17 G PO PACK
17.0000 g | PACK | Freq: Every day | ORAL | Status: DC
  Administered 2023-02-01: 17 g via ORAL
  Filled 2023-02-01: qty 1

## 2023-02-01 MED ORDER — ACETAMINOPHEN 500 MG PO TABS
1000.0000 mg | ORAL_TABLET | Freq: Three times a day (TID) | ORAL | Status: DC
  Administered 2023-02-01 (×2): 1000 mg via ORAL
  Filled 2023-02-01 (×2): qty 2

## 2023-02-01 MED ORDER — POLYETHYLENE GLYCOL 3350 17 G PO PACK: 17.0000 g | PACK | Freq: Every day | ORAL | 0 refills | Status: AC

## 2023-02-01 MED ORDER — GABAPENTIN 300 MG PO CAPS: 300.0000 mg | ORAL_CAPSULE | Freq: Every day | ORAL | Status: AC

## 2023-02-01 MED ORDER — SENNA 8.6 MG PO TABS: 1.0000 | ORAL_TABLET | Freq: Every day | ORAL | 0 refills | Status: AC | PRN

## 2023-02-01 MED ORDER — DOCUSATE SODIUM 100 MG PO CAPS: 100.0000 mg | ORAL_CAPSULE | Freq: Two times a day (BID) | ORAL | 0 refills | Status: AC

## 2023-02-01 MED ORDER — OXYCODONE HCL 5 MG PO TABS: 5.0000 mg | ORAL_TABLET | ORAL | 0 refills | Status: AC | PRN

## 2023-02-01 MED ORDER — ACETAMINOPHEN 500 MG PO TABS: 1000.0000 mg | ORAL_TABLET | Freq: Three times a day (TID) | ORAL | 0 refills | Status: AC

## 2023-02-01 MED ORDER — DOCUSATE SODIUM 100 MG PO CAPS
100.0000 mg | ORAL_CAPSULE | Freq: Two times a day (BID) | ORAL | Status: DC
  Administered 2023-02-01: 100 mg via ORAL
  Filled 2023-02-01: qty 1

## 2023-02-01 NOTE — TOC Transition Note (Signed)
Transition of Care [facility]) - CM/SW Discharge Note   Patient Details  Name: Vanessa Aguilar MRN: [id number] Date of Birth: Jan 01, 1941  Transition of Care [facility]) CM/SW Contact:  Leitha Bleak, RN Phone Number: 02/01/2023, 1:01 PM   Clinical Narrative:  Insurance Auth approved 5/1 - 5/3, review due 5/3. Reference ID: [id number]. [facility] provided room #127. CM update RN to call report and Daughter at the beside. TOC will call EMS when RN is ready. DC summary sent in the hub.     Final next level of care: Skilled Nursing Facility Barriers to Discharge: Barriers Resolved   Patient Goals and CMS Choice CMS Medicare.gov Compare Post Acute Care list provided to:: Patient Represenative (must comment) Choice offered to / list presented to : Adult Children  Discharge Placement                    Name of family member notified: daughter Patient and family notified of of transfer: 02/01/23  Discharge Plan and Services Additional resources added to the After Visit Summary for       Post Acute Care Choice: Skilled Nursing Facility                 Social Determinants of Health (SDOH) Interventions SDOH Screenings   Food Insecurity: No Food Insecurity (01/31/2023)  Housing: Low Risk  (01/31/2023)  Transportation Needs: No Transportation Needs (01/31/2023)  Utilities: Not At Risk (01/31/2023)  Depression (PHQ2-9): Low Risk  (12/23/2019)  Tobacco Use: Low Risk  (01/30/2023)     Readmission Risk Interventions     No data to display

## 2023-02-01 NOTE — Discharge Summary (Signed)
Physician Discharge Summary  Vanessa Aguilar ZOX:[id number] DOB: [birth year] DOA: 01/30/2023  PCP: Benita Stabile, MD  Admit date: 01/30/2023 Discharge date: 02/01/2023  Admitted From: home Discharge disposition: SNF   Recommendations for Outpatient Follow-Up:   Bowel regimen Wear TLSO brace when out of bed Resume BP meds as needed Scheduled tylenol for 2 weeks then change to PRN Outpatient NS follow up   Discharge Diagnosis:   Principal Problem:   Intractable back pain Active Problems:   Essential (primary) hypertension   Type 2 diabetes mellitus with other diabetic kidney complication (HCC)   Compression fracture of L3 lumbar vertebra, closed, initial encounter (HCC)   Generalized weakness   Hypokalemia    Discharge Condition: Improved.  Diet recommendation: Low sodium, heart healthy.   Wound care: None.  Code status: Full.   History of Present Illness:   Vanessa Aguilar is a 82 y.o. female with medical history significant of CKD, diabetes mellitus type 2, hypertension, hyperlipidemia, osteoarthritis, who lives alone and presents the hospital today secondary to difficulty ambulating and back pain.  Patient had a fall 5 days ago.  She reports she was turning around in her closet when she lost her footing and fell backwards.  She is not sure if she hit her head but she does not think that she did.  She reports no loss of consciousness.  She was not able to get up on her own and had to call her neighbor to pick her up.  She went to church that evening like a normal day.  She felt a little sore, but it was manageable.  The pain has gotten worse since then.  Patient has been able to ambulate less and less since then.  She has not been eating because she does not want to get up to have to go to the bathroom.    Hospital Course by Problem:   Intractable back pain Secondary to compression fracture which is secondary to fall. Pain affect ability to ambulate. -Continue  oxycodone and schedule tylenol -PT/OT evaluation-- SNF -Continue TLSO brace with ambulation -Outpatient neurosurgery follow-up -bowel regimen while on pain meds   Hypokalemia - Due to poor p.o. intake - repleted   Generalized weakness - Ongoing for some time - Daughter reports improved PO intake now - PT eval and treat- SNF    Compression fracture of L3 lumbar vertebra, closed, initial encounter (HCC) - Secondary to fall - Brace in place - No acute neuro changes - Pain control - PT/OT eval and treat   Type 2 diabetes mellitus with other diabetic kidney complication (HCC) - resume home meds   Essential (primary) hypertension -resume BP meds as needed    Medical Consultants:      Discharge Exam:   Vitals:   02/01/23 0420 02/01/23 1255  BP: (!) 95/55 137/67  Pulse: 79 91  Resp: 18 18  Temp: 98.6 F (37 C) 98.7 F (37.1 C)  SpO2: 95% 96%   Vitals:   01/31/23 1151 01/31/23 2026 02/01/23 0420 02/01/23 1255  BP: 125/83 (!) 131/52 (!) 95/55 137/67  Pulse: 99 100 79 91  Resp: 18 18 18 18   Temp: 98.4 F (36.9 C) 98.2 F (36.8 C) 98.6 F (37 C) 98.7 F (37.1 C)  TempSrc: Oral Oral Oral Oral  SpO2: 99% 95% 95% 96%  Weight:      Height:        General exam: Appears calm and comfortable.  The results of  significant diagnostics from this hospitalization (including imaging, microbiology, ancillary and laboratory) are listed below for reference.     Procedures and Diagnostic Studies:   CT Lumbar Spine Wo Contrast  Result Date: 01/30/2023 CLINICAL DATA:  Back trauma EXAM: CT LUMBAR SPINE WITHOUT CONTRAST TECHNIQUE: Multidetector CT imaging of the lumbar spine was performed without intravenous contrast administration. Multiplanar CT image reconstructions were also generated. RADIATION DOSE REDUCTION: This exam was performed according to the departmental dose-optimization program which includes automated exposure control, adjustment of the mA and/or kV according  to patient size and/or use of iterative reconstruction technique. COMPARISON:  None Available. FINDINGS: Segmentation: 5 lumbar type vertebrae. Alignment: There is 4 mm of anterolisthesis at L3-L4. Vertebrae: Bones are diffusely osteopenic. There is acute appearing compression fracture of L3 with 50% loss vertebral body height. There is no retropulsion of fracture fragments. No other focal osseous lesion identified. Paraspinal and other soft tissues: There is mild paravertebral edema anterior to L2, L3 and L4. There is no focal hematoma. There are atherosclerotic calcifications of the aorta. Gallstones are present. Disc levels: There is mild disc space narrowing at L3-L4. There is moderate disc space narrowing at L5-S1. Vacuum disc phenomena is seen at L3-L4 and L4-L5. L1-L2: Within normal limits. L2-L3: Mild disc bulge and bilateral facet arthropathy. Mild central canal stenosis. No neural foraminal stenosis. L3-L4: Broad-based disc bulge and bilateral facet arthropathy causing moderate severe central canal stenosis at this level. Mild bilateral neural foraminal stenosis. L4-L5: Broad-based disc bulge with bilateral facet arthropathy. No central canal or neural foraminal stenosis. L5-S1: Posterior disc osteophyte complex. Moderate right neural foraminal stenosis. No central canal stenosis. IMPRESSION: 1. Acute compression fracture of L3 with 50% loss vertebral body height. No retropulsion of fracture fragments. 2. Multilevel degenerative changes of the lumbar spine, most severe at L3-L4 where there is moderate to severe central canal stenosis. 3. Cholelithiasis. Aortic Atherosclerosis (ICD10-I70.0). Electronically Signed   By: Darliss Cheney M.D.   On: 01/30/2023 18:02     Labs:   Basic Metabolic Panel: Recent Labs  Lab 01/30/23 1926 01/31/23 0029 01/31/23 0504  NA 135 134*  --   K 3.4* 3.5  --   CL 100 100  --   CO2 27 25  --   GLUCOSE 112* 160*  --   BUN 17 16  --   CREATININE 0.76 0.79  --    CALCIUM 9.1 8.9  --   MG  --   --  1.7   GFR Estimated Creatinine Clearance: 45.2 mL/min (by C-G formula based on SCr of 0.79 mg/dL). Liver Function Tests: Recent Labs  Lab 01/31/23 0029  AST 22  ALT 15  ALKPHOS 54  BILITOT 0.8  PROT 6.5  ALBUMIN 3.6   No results for input(s): "LIPASE", "AMYLASE" in the last 168 hours. No results for input(s): "AMMONIA" in the last 168 hours. Coagulation profile No results for input(s): "INR", "PROTIME" in the last 168 hours.  CBC: Recent Labs  Lab 01/30/23 1926 01/31/23 0504  WBC 7.6 6.9  NEUTROABS 4.5 3.4  HGB 12.2 11.5*  HCT 37.1 35.5*  MCV 89.4 89.6  PLT 228 223   Cardiac Enzymes: No results for input(s): "CKTOTAL", "CKMB", "CKMBINDEX", "TROPONINI" in the last 168 hours. BNP: Invalid input(s): "POCBNP" CBG: Recent Labs  Lab 01/31/23 1148 01/31/23 1649 01/31/23 2028 02/01/23 0732 02/01/23 1229  GLUCAP 168* 150* 182* 132* 174*   D-Dimer No results for input(s): "DDIMER" in the last 72 hours. Hgb A1c Recent Labs  01/30/23 1926  HGBA1C 5.7*   Lipid Profile No results for input(s): "CHOL", "HDL", "LDLCALC", "TRIG", "CHOLHDL", "LDLDIRECT" in the last 72 hours. Thyroid function studies Recent Labs    01/31/23 0504  TSH 0.699   Anemia work up Recent Labs    01/31/23 0504  VITAMINB12 942*  FOLATE 28.3   Microbiology No results found for this or any previous visit (from the past 240 hour(s)).   Discharge Instructions:   Discharge Instructions     Diet - low sodium heart healthy   Complete by: As directed    Increase activity slowly   Complete by: As directed       Allergies as of 02/01/2023   No Known Allergies      Medication List     STOP taking these medications    hydrochlorothiazide 12.5 MG capsule Commonly known as: MICROZIDE   losartan 25 MG tablet Commonly known as: COZAAR       TAKE these medications    acetaminophen 500 MG tablet Commonly known as: TYLENOL Take 2 tablets  (1,000 mg total) by mouth 3 (three) times daily.   ascorbic acid 500 MG tablet Commonly known as: VITAMIN C Take 500 mg by mouth daily.   docusate sodium 100 MG capsule Commonly known as: COLACE Take 1 capsule (100 mg total) by mouth 2 (two) times daily.   gabapentin 300 MG capsule Commonly known as: NEURONTIN Take 1 capsule (300 mg total) by mouth at bedtime.   glucose blood test strip Use as instructed   metFORMIN 500 MG tablet Commonly known as: GLUCOPHAGE Take 500 mg by mouth 2 (two) times daily with a meal.   multivitamin tablet Take 1 tablet by mouth daily.   onetouch ultrasoft lancets Use as instructed   oxyCODONE 5 MG immediate release tablet Commonly known as: Oxy IR/ROXICODONE Take 1 tablet (5 mg total) by mouth every 4 (four) hours as needed for moderate pain.   polyethylene glycol 17 g packet Commonly known as: MIRALAX / GLYCOLAX Take 17 g by mouth daily. Start taking on: Feb 02, 2023   senna 8.6 MG Tabs tablet Commonly known as: SENOKOT Take 1 tablet (8.6 mg total) by mouth daily as needed for mild constipation.        Contact information for after-discharge care     Destination     [facility] Preferred SNF .   Service: Skilled Nursing Contact information: [address] [place] [address] [phone]                      Time coordinating discharge: 45 min  Signed:  Joseph Art DO  [facility] 02/01/2023, 1:02 PM

## 2023-02-01 NOTE — Progress Notes (Signed)
Patient slept quietly through the night after readjusting vest, repositioning for comfort and administering PRN for pain with effectiveness noted within the hour pain decreased from 7/10 to asleep. Took am medication and has no concerns at this time.

## 2023-02-01 NOTE — Progress Notes (Signed)
PROGRESS NOTE    Vanessa ALIA  Aguilar:[id number] DOB: [birth year] DOA: 01/30/2023 PCP: Benita Stabile, MD    Brief Narrative:   Vanessa Aguilar is a 82 y.o. female with medical history significant of CKD, diabetes mellitus type 2, hypertension, hyperlipidemia, osteoarthritis, who lives alone and presents the hospital today secondary to difficulty ambulating and back pain.  Patient had a fall 5 days ago.  She reports she was turning around in her closet when she lost her footing and fell backwards.  She is not sure if she hit her head but she does not think that she did.  She reports no loss of consciousness.  She was not able to get up on her own and had to call her neighbor to pick her up.  She went to church that evening like a normal day.  She felt a little sore, but it was manageable.  The pain has gotten worse since then.  Patient has been able to ambulate less and less since then.  She has not been eating because she does not want to get up to have to go to the bathroom.    Assessment and Plan: Intractable back pain Secondary to compression fracture which is secondary to fall. Pain affect ability to ambulate. -Continue oxycodone and schedule tylenol -PT/OT evaluation-- SNF -Continue TLSO brace with ambulation -Outpatient neurosurgery follow-up -bowel regimen while on pain meds  Hypokalemia - Due to poor p.o. intake - replete  Generalized weakness - Ongoing for some time - Daughter reports poor p.o. intake - PT eval and treat- SNF - Continue to monitor  Compression fracture of L3 lumbar vertebra, closed, initial encounter (HCC) - Secondary to fall - Brace in place - No acute neuro changes - Pain control - PT/OT eval and treat - Continue to monitor  Type 2 diabetes mellitus with other diabetic kidney complication (HCC) - Hold metformin - Sliding scale coverage - Continue to monitor  Essential (primary) hypertension - BP low so will hold meds   DVT prophylaxis: heparin  injection 5,000 Units Start: 01/31/23 0600 SCDs Start: 01/31/23 0008    Code Status: DNR Family Communication: at bedside  Disposition Plan:  Level of care: Med-Surg Status is: Inpatient Remains inpatient appropriate because: needs SNF placement-- medically ready    Consultants:  none   Subjective: Hard of hearing but comfortable in bed currently  Objective: Vitals:   01/31/23 0744 01/31/23 1151 01/31/23 2026 02/01/23 0420  BP: 129/60 125/83 (!) 131/52 (!) 95/55  Pulse: 89 99 100 79  Resp: 18 18 18 18   Temp: 97.8 F (36.6 C) 98.4 F (36.9 C) 98.2 F (36.8 C) 98.6 F (37 C)  TempSrc: Oral Oral Oral Oral  SpO2: 96% 99% 95% 95%  Weight:      Height:       No intake or output data in the 24 hours ending 02/01/23 1051 Filed Weights   01/30/23 1501 01/31/23 0000  Weight: 64.9 kg 60.7 kg    Examination:   General: Appearance:    Well developed, well nourished female in no acute distress     Lungs:     Clear to auscultation bilaterally, respirations unlabored  Heart:    Normal heart rate. Normal rhythm. No murmurs, rubs, or gallops.    MS:   All extremities are intact.    Neurologic:   Awake, alert, very hard of hearing       Data Reviewed: I have personally reviewed following labs and imaging studies  CBC: Recent Labs  Lab 01/30/23 1926 01/31/23 0504  WBC 7.6 6.9  NEUTROABS 4.5 3.4  HGB 12.2 11.5*  HCT 37.1 35.5*  MCV 89.4 89.6  PLT 228 223   Basic Metabolic Panel: Recent Labs  Lab 01/30/23 1926 01/31/23 0029 01/31/23 0504  NA 135 134*  --   K 3.4* 3.5  --   CL 100 100  --   CO2 27 25  --   GLUCOSE 112* 160*  --   BUN 17 16  --   CREATININE 0.76 0.79  --   CALCIUM 9.1 8.9  --   MG  --   --  1.7   GFR: Estimated Creatinine Clearance: 45.2 mL/min (by C-G formula based on SCr of 0.79 mg/dL). Liver Function Tests: Recent Labs  Lab 01/31/23 0029  AST 22  ALT 15  ALKPHOS 54  BILITOT 0.8  PROT 6.5  ALBUMIN 3.6   No results for  input(s): "LIPASE", "AMYLASE" in the last 168 hours. No results for input(s): "AMMONIA" in the last 168 hours. Coagulation Profile: No results for input(s): "INR", "PROTIME" in the last 168 hours. Cardiac Enzymes: No results for input(s): "CKTOTAL", "CKMB", "CKMBINDEX", "TROPONINI" in the last 168 hours. BNP (last 3 results) No results for input(s): "PROBNP" in the last 8760 hours. HbA1C: Recent Labs    01/30/23 1926  HGBA1C 5.7*   CBG: Recent Labs  Lab 01/31/23 0743 01/31/23 1148 01/31/23 1649 01/31/23 2028 02/01/23 0732  GLUCAP 161* 168* 150* 182* 132*   Lipid Profile: No results for input(s): "CHOL", "HDL", "LDLCALC", "TRIG", "CHOLHDL", "LDLDIRECT" in the last 72 hours. Thyroid Function Tests: Recent Labs    01/31/23 0504  TSH 0.699   Anemia Panel: Recent Labs    01/31/23 0504  VITAMINB12 942*  FOLATE 28.3   Sepsis Labs: No results for input(s): "PROCALCITON", "LATICACIDVEN" in the last 168 hours.  No results found for this or any previous visit (from the past 240 hour(s)).       Radiology Studies: CT Lumbar Spine Wo Contrast  Result Date: 01/30/2023 CLINICAL DATA:  Back trauma EXAM: CT LUMBAR SPINE WITHOUT CONTRAST TECHNIQUE: Multidetector CT imaging of the lumbar spine was performed without intravenous contrast administration. Multiplanar CT image reconstructions were also generated. RADIATION DOSE REDUCTION: This exam was performed according to the departmental dose-optimization program which includes automated exposure control, adjustment of the mA and/or kV according to patient size and/or use of iterative reconstruction technique. COMPARISON:  None Available. FINDINGS: Segmentation: 5 lumbar type vertebrae. Alignment: There is 4 mm of anterolisthesis at L3-L4. Vertebrae: Bones are diffusely osteopenic. There is acute appearing compression fracture of L3 with 50% loss vertebral body height. There is no retropulsion of fracture fragments. No other focal  osseous lesion identified. Paraspinal and other soft tissues: There is mild paravertebral edema anterior to L2, L3 and L4. There is no focal hematoma. There are atherosclerotic calcifications of the aorta. Gallstones are present. Disc levels: There is mild disc space narrowing at L3-L4. There is moderate disc space narrowing at L5-S1. Vacuum disc phenomena is seen at L3-L4 and L4-L5. L1-L2: Within normal limits. L2-L3: Mild disc bulge and bilateral facet arthropathy. Mild central canal stenosis. No neural foraminal stenosis. L3-L4: Broad-based disc bulge and bilateral facet arthropathy causing moderate severe central canal stenosis at this level. Mild bilateral neural foraminal stenosis. L4-L5: Broad-based disc bulge with bilateral facet arthropathy. No central canal or neural foraminal stenosis. L5-S1: Posterior disc osteophyte complex. Moderate right neural foraminal stenosis. No central canal  stenosis. IMPRESSION: 1. Acute compression fracture of L3 with 50% loss vertebral body height. No retropulsion of fracture fragments. 2. Multilevel degenerative changes of the lumbar spine, most severe at L3-L4 where there is moderate to severe central canal stenosis. 3. Cholelithiasis. Aortic Atherosclerosis (ICD10-I70.0). Electronically Signed   By: Darliss Cheney M.D.   On: 01/30/2023 18:02        Scheduled Meds:  acetaminophen  1,000 mg Oral TID   docusate sodium  100 mg Oral BID   gabapentin  300 mg Oral QHS   heparin  5,000 Units Subcutaneous Q8H   insulin aspart  0-15 Units Subcutaneous TID WC   insulin aspart  0-5 Units Subcutaneous QHS   polyethylene glycol  17 g Oral Daily   Continuous Infusions:   LOS: 1 day    Time spent: 45 minutes spent on chart review, discussion with nursing staff, consultants, updating family and interview/physical exam; more than 50% of that time was spent in counseling and/or coordination of care.    Joseph Art, DO [facility] Available via Epic secure  chat 7am-7pm After these hours, please refer to coverage provider listed on [email or web address] 02/01/2023, 10:51 AM

## 2023-02-02 DIAGNOSIS — S32030A Wedge compression fracture of third lumbar vertebra, initial encounter for closed fracture: Secondary | ICD-10-CM | POA: Diagnosis not present

## 2023-02-02 DIAGNOSIS — I10 Essential (primary) hypertension: Secondary | ICD-10-CM | POA: Diagnosis not present

## 2023-02-02 DIAGNOSIS — R531 Weakness: Secondary | ICD-10-CM | POA: Diagnosis not present

## 2023-02-02 LAB — VITAMIN B1: Vitamin B1 (Thiamine): 80 nmol/L (ref 66.5–200.0)

## 2023-02-13 DIAGNOSIS — S32030A Wedge compression fracture of third lumbar vertebra, initial encounter for closed fracture: Secondary | ICD-10-CM | POA: Diagnosis not present

## 2023-02-20 ENCOUNTER — Other Ambulatory Visit: Payer: Self-pay | Admitting: Neurosurgery

## 2023-02-27 DIAGNOSIS — S32030D Wedge compression fracture of third lumbar vertebra, subsequent encounter for fracture with routine healing: Secondary | ICD-10-CM | POA: Diagnosis not present

## 2023-02-27 DIAGNOSIS — M62521 Muscle wasting and atrophy, not elsewhere classified, right upper arm: Secondary | ICD-10-CM | POA: Diagnosis not present

## 2023-02-27 DIAGNOSIS — M62561 Muscle wasting and atrophy, not elsewhere classified, right lower leg: Secondary | ICD-10-CM | POA: Diagnosis not present

## 2023-02-27 DIAGNOSIS — M62562 Muscle wasting and atrophy, not elsewhere classified, left lower leg: Secondary | ICD-10-CM | POA: Diagnosis not present

## 2023-02-27 DIAGNOSIS — R2689 Other abnormalities of gait and mobility: Secondary | ICD-10-CM | POA: Diagnosis not present

## 2023-02-27 DIAGNOSIS — M62522 Muscle wasting and atrophy, not elsewhere classified, left upper arm: Secondary | ICD-10-CM | POA: Diagnosis not present

## 2023-02-27 DIAGNOSIS — R4789 Other speech disturbances: Secondary | ICD-10-CM | POA: Diagnosis not present

## 2023-02-27 DIAGNOSIS — S32030S Wedge compression fracture of third lumbar vertebra, sequela: Secondary | ICD-10-CM | POA: Diagnosis not present

## 2023-02-28 DIAGNOSIS — M62522 Muscle wasting and atrophy, not elsewhere classified, left upper arm: Secondary | ICD-10-CM | POA: Diagnosis not present

## 2023-02-28 DIAGNOSIS — S32030S Wedge compression fracture of third lumbar vertebra, sequela: Secondary | ICD-10-CM | POA: Diagnosis not present

## 2023-02-28 DIAGNOSIS — R4789 Other speech disturbances: Secondary | ICD-10-CM | POA: Diagnosis not present

## 2023-02-28 DIAGNOSIS — M62521 Muscle wasting and atrophy, not elsewhere classified, right upper arm: Secondary | ICD-10-CM | POA: Diagnosis not present

## 2023-02-28 DIAGNOSIS — M62561 Muscle wasting and atrophy, not elsewhere classified, right lower leg: Secondary | ICD-10-CM | POA: Diagnosis not present

## 2023-02-28 DIAGNOSIS — S32030D Wedge compression fracture of third lumbar vertebra, subsequent encounter for fracture with routine healing: Secondary | ICD-10-CM | POA: Diagnosis not present

## 2023-02-28 DIAGNOSIS — R2689 Other abnormalities of gait and mobility: Secondary | ICD-10-CM | POA: Diagnosis not present

## 2023-02-28 DIAGNOSIS — M62562 Muscle wasting and atrophy, not elsewhere classified, left lower leg: Secondary | ICD-10-CM | POA: Diagnosis not present

## 2023-03-01 DIAGNOSIS — R2689 Other abnormalities of gait and mobility: Secondary | ICD-10-CM | POA: Diagnosis not present

## 2023-03-01 DIAGNOSIS — M62521 Muscle wasting and atrophy, not elsewhere classified, right upper arm: Secondary | ICD-10-CM | POA: Diagnosis not present

## 2023-03-01 DIAGNOSIS — S32030D Wedge compression fracture of third lumbar vertebra, subsequent encounter for fracture with routine healing: Secondary | ICD-10-CM | POA: Diagnosis not present

## 2023-03-01 DIAGNOSIS — R4789 Other speech disturbances: Secondary | ICD-10-CM | POA: Diagnosis not present

## 2023-03-01 DIAGNOSIS — M62561 Muscle wasting and atrophy, not elsewhere classified, right lower leg: Secondary | ICD-10-CM | POA: Diagnosis not present

## 2023-03-01 DIAGNOSIS — M62562 Muscle wasting and atrophy, not elsewhere classified, left lower leg: Secondary | ICD-10-CM | POA: Diagnosis not present

## 2023-03-01 DIAGNOSIS — S32030S Wedge compression fracture of third lumbar vertebra, sequela: Secondary | ICD-10-CM | POA: Diagnosis not present

## 2023-03-01 DIAGNOSIS — M62522 Muscle wasting and atrophy, not elsewhere classified, left upper arm: Secondary | ICD-10-CM | POA: Diagnosis not present

## 2023-03-02 DIAGNOSIS — R4789 Other speech disturbances: Secondary | ICD-10-CM | POA: Diagnosis not present

## 2023-03-02 DIAGNOSIS — S32030D Wedge compression fracture of third lumbar vertebra, subsequent encounter for fracture with routine healing: Secondary | ICD-10-CM | POA: Diagnosis not present

## 2023-03-02 DIAGNOSIS — M62521 Muscle wasting and atrophy, not elsewhere classified, right upper arm: Secondary | ICD-10-CM | POA: Diagnosis not present

## 2023-03-02 DIAGNOSIS — M62562 Muscle wasting and atrophy, not elsewhere classified, left lower leg: Secondary | ICD-10-CM | POA: Diagnosis not present

## 2023-03-02 DIAGNOSIS — M62522 Muscle wasting and atrophy, not elsewhere classified, left upper arm: Secondary | ICD-10-CM | POA: Diagnosis not present

## 2023-03-02 DIAGNOSIS — R2689 Other abnormalities of gait and mobility: Secondary | ICD-10-CM | POA: Diagnosis not present

## 2023-03-02 DIAGNOSIS — M62561 Muscle wasting and atrophy, not elsewhere classified, right lower leg: Secondary | ICD-10-CM | POA: Diagnosis not present

## 2023-03-02 DIAGNOSIS — S32030S Wedge compression fracture of third lumbar vertebra, sequela: Secondary | ICD-10-CM | POA: Diagnosis not present

## 2023-03-03 DIAGNOSIS — S32030D Wedge compression fracture of third lumbar vertebra, subsequent encounter for fracture with routine healing: Secondary | ICD-10-CM | POA: Diagnosis not present

## 2023-03-03 DIAGNOSIS — M62521 Muscle wasting and atrophy, not elsewhere classified, right upper arm: Secondary | ICD-10-CM | POA: Diagnosis not present

## 2023-03-03 DIAGNOSIS — M62562 Muscle wasting and atrophy, not elsewhere classified, left lower leg: Secondary | ICD-10-CM | POA: Diagnosis not present

## 2023-03-03 DIAGNOSIS — R2689 Other abnormalities of gait and mobility: Secondary | ICD-10-CM | POA: Diagnosis not present

## 2023-03-03 DIAGNOSIS — S32030S Wedge compression fracture of third lumbar vertebra, sequela: Secondary | ICD-10-CM | POA: Diagnosis not present

## 2023-03-03 DIAGNOSIS — M62522 Muscle wasting and atrophy, not elsewhere classified, left upper arm: Secondary | ICD-10-CM | POA: Diagnosis not present

## 2023-03-03 DIAGNOSIS — R4789 Other speech disturbances: Secondary | ICD-10-CM | POA: Diagnosis not present

## 2023-03-03 DIAGNOSIS — M62561 Muscle wasting and atrophy, not elsewhere classified, right lower leg: Secondary | ICD-10-CM | POA: Diagnosis not present

## 2023-03-06 DIAGNOSIS — M62521 Muscle wasting and atrophy, not elsewhere classified, right upper arm: Secondary | ICD-10-CM | POA: Diagnosis not present

## 2023-03-06 DIAGNOSIS — R4789 Other speech disturbances: Secondary | ICD-10-CM | POA: Diagnosis not present

## 2023-03-06 DIAGNOSIS — S32030S Wedge compression fracture of third lumbar vertebra, sequela: Secondary | ICD-10-CM | POA: Diagnosis not present

## 2023-03-06 DIAGNOSIS — M62561 Muscle wasting and atrophy, not elsewhere classified, right lower leg: Secondary | ICD-10-CM | POA: Diagnosis not present

## 2023-03-06 DIAGNOSIS — M62562 Muscle wasting and atrophy, not elsewhere classified, left lower leg: Secondary | ICD-10-CM | POA: Diagnosis not present

## 2023-03-06 DIAGNOSIS — R2689 Other abnormalities of gait and mobility: Secondary | ICD-10-CM | POA: Diagnosis not present

## 2023-03-06 DIAGNOSIS — S32030D Wedge compression fracture of third lumbar vertebra, subsequent encounter for fracture with routine healing: Secondary | ICD-10-CM | POA: Diagnosis not present

## 2023-03-06 DIAGNOSIS — M62522 Muscle wasting and atrophy, not elsewhere classified, left upper arm: Secondary | ICD-10-CM | POA: Diagnosis not present

## 2023-03-07 DIAGNOSIS — R2689 Other abnormalities of gait and mobility: Secondary | ICD-10-CM | POA: Diagnosis not present

## 2023-03-07 DIAGNOSIS — M62561 Muscle wasting and atrophy, not elsewhere classified, right lower leg: Secondary | ICD-10-CM | POA: Diagnosis not present

## 2023-03-07 DIAGNOSIS — M62521 Muscle wasting and atrophy, not elsewhere classified, right upper arm: Secondary | ICD-10-CM | POA: Diagnosis not present

## 2023-03-07 DIAGNOSIS — S32030S Wedge compression fracture of third lumbar vertebra, sequela: Secondary | ICD-10-CM | POA: Diagnosis not present

## 2023-03-07 DIAGNOSIS — M62562 Muscle wasting and atrophy, not elsewhere classified, left lower leg: Secondary | ICD-10-CM | POA: Diagnosis not present

## 2023-03-07 DIAGNOSIS — R4789 Other speech disturbances: Secondary | ICD-10-CM | POA: Diagnosis not present

## 2023-03-07 DIAGNOSIS — S32030D Wedge compression fracture of third lumbar vertebra, subsequent encounter for fracture with routine healing: Secondary | ICD-10-CM | POA: Diagnosis not present

## 2023-03-07 DIAGNOSIS — M62522 Muscle wasting and atrophy, not elsewhere classified, left upper arm: Secondary | ICD-10-CM | POA: Diagnosis not present

## 2023-03-08 DIAGNOSIS — S32030D Wedge compression fracture of third lumbar vertebra, subsequent encounter for fracture with routine healing: Secondary | ICD-10-CM | POA: Diagnosis not present

## 2023-03-08 DIAGNOSIS — M62561 Muscle wasting and atrophy, not elsewhere classified, right lower leg: Secondary | ICD-10-CM | POA: Diagnosis not present

## 2023-03-08 DIAGNOSIS — M62521 Muscle wasting and atrophy, not elsewhere classified, right upper arm: Secondary | ICD-10-CM | POA: Diagnosis not present

## 2023-03-08 DIAGNOSIS — R4789 Other speech disturbances: Secondary | ICD-10-CM | POA: Diagnosis not present

## 2023-03-08 DIAGNOSIS — M62562 Muscle wasting and atrophy, not elsewhere classified, left lower leg: Secondary | ICD-10-CM | POA: Diagnosis not present

## 2023-03-08 DIAGNOSIS — M62522 Muscle wasting and atrophy, not elsewhere classified, left upper arm: Secondary | ICD-10-CM | POA: Diagnosis not present

## 2023-03-08 DIAGNOSIS — S32030S Wedge compression fracture of third lumbar vertebra, sequela: Secondary | ICD-10-CM | POA: Diagnosis not present

## 2023-03-08 DIAGNOSIS — R2689 Other abnormalities of gait and mobility: Secondary | ICD-10-CM | POA: Diagnosis not present

## 2023-03-09 DIAGNOSIS — S32030S Wedge compression fracture of third lumbar vertebra, sequela: Secondary | ICD-10-CM | POA: Diagnosis not present

## 2023-03-09 DIAGNOSIS — M62561 Muscle wasting and atrophy, not elsewhere classified, right lower leg: Secondary | ICD-10-CM | POA: Diagnosis not present

## 2023-03-09 DIAGNOSIS — R4789 Other speech disturbances: Secondary | ICD-10-CM | POA: Diagnosis not present

## 2023-03-09 DIAGNOSIS — S32030D Wedge compression fracture of third lumbar vertebra, subsequent encounter for fracture with routine healing: Secondary | ICD-10-CM | POA: Diagnosis not present

## 2023-03-09 DIAGNOSIS — M62521 Muscle wasting and atrophy, not elsewhere classified, right upper arm: Secondary | ICD-10-CM | POA: Diagnosis not present

## 2023-03-09 DIAGNOSIS — M62562 Muscle wasting and atrophy, not elsewhere classified, left lower leg: Secondary | ICD-10-CM | POA: Diagnosis not present

## 2023-03-09 DIAGNOSIS — M62522 Muscle wasting and atrophy, not elsewhere classified, left upper arm: Secondary | ICD-10-CM | POA: Diagnosis not present

## 2023-03-09 DIAGNOSIS — R2689 Other abnormalities of gait and mobility: Secondary | ICD-10-CM | POA: Diagnosis not present

## 2023-03-10 ENCOUNTER — Ambulatory Visit: Admit: 2023-03-10 | Payer: Medicare Other | Admitting: Neurosurgery

## 2023-03-10 DIAGNOSIS — M62522 Muscle wasting and atrophy, not elsewhere classified, left upper arm: Secondary | ICD-10-CM | POA: Diagnosis not present

## 2023-03-10 DIAGNOSIS — M62562 Muscle wasting and atrophy, not elsewhere classified, left lower leg: Secondary | ICD-10-CM | POA: Diagnosis not present

## 2023-03-10 DIAGNOSIS — M62561 Muscle wasting and atrophy, not elsewhere classified, right lower leg: Secondary | ICD-10-CM | POA: Diagnosis not present

## 2023-03-10 DIAGNOSIS — S32030D Wedge compression fracture of third lumbar vertebra, subsequent encounter for fracture with routine healing: Secondary | ICD-10-CM | POA: Diagnosis not present

## 2023-03-10 DIAGNOSIS — R2689 Other abnormalities of gait and mobility: Secondary | ICD-10-CM | POA: Diagnosis not present

## 2023-03-10 DIAGNOSIS — R4789 Other speech disturbances: Secondary | ICD-10-CM | POA: Diagnosis not present

## 2023-03-10 DIAGNOSIS — M62521 Muscle wasting and atrophy, not elsewhere classified, right upper arm: Secondary | ICD-10-CM | POA: Diagnosis not present

## 2023-03-10 DIAGNOSIS — S32030S Wedge compression fracture of third lumbar vertebra, sequela: Secondary | ICD-10-CM | POA: Diagnosis not present

## 2023-03-10 SURGERY — KYPHOPLASTY
Anesthesia: General | Laterality: Bilateral

## 2023-03-13 DIAGNOSIS — M62521 Muscle wasting and atrophy, not elsewhere classified, right upper arm: Secondary | ICD-10-CM | POA: Diagnosis not present

## 2023-03-13 DIAGNOSIS — S32030D Wedge compression fracture of third lumbar vertebra, subsequent encounter for fracture with routine healing: Secondary | ICD-10-CM | POA: Diagnosis not present

## 2023-03-13 DIAGNOSIS — S32030S Wedge compression fracture of third lumbar vertebra, sequela: Secondary | ICD-10-CM | POA: Diagnosis not present

## 2023-03-13 DIAGNOSIS — M62561 Muscle wasting and atrophy, not elsewhere classified, right lower leg: Secondary | ICD-10-CM | POA: Diagnosis not present

## 2023-03-13 DIAGNOSIS — M62562 Muscle wasting and atrophy, not elsewhere classified, left lower leg: Secondary | ICD-10-CM | POA: Diagnosis not present

## 2023-03-13 DIAGNOSIS — R4789 Other speech disturbances: Secondary | ICD-10-CM | POA: Diagnosis not present

## 2023-03-13 DIAGNOSIS — M62522 Muscle wasting and atrophy, not elsewhere classified, left upper arm: Secondary | ICD-10-CM | POA: Diagnosis not present

## 2023-03-13 DIAGNOSIS — R2689 Other abnormalities of gait and mobility: Secondary | ICD-10-CM | POA: Diagnosis not present

## 2023-03-14 DIAGNOSIS — S32030S Wedge compression fracture of third lumbar vertebra, sequela: Secondary | ICD-10-CM | POA: Diagnosis not present

## 2023-03-14 DIAGNOSIS — R2689 Other abnormalities of gait and mobility: Secondary | ICD-10-CM | POA: Diagnosis not present

## 2023-03-14 DIAGNOSIS — M62522 Muscle wasting and atrophy, not elsewhere classified, left upper arm: Secondary | ICD-10-CM | POA: Diagnosis not present

## 2023-03-14 DIAGNOSIS — S32030D Wedge compression fracture of third lumbar vertebra, subsequent encounter for fracture with routine healing: Secondary | ICD-10-CM | POA: Diagnosis not present

## 2023-03-14 DIAGNOSIS — M62561 Muscle wasting and atrophy, not elsewhere classified, right lower leg: Secondary | ICD-10-CM | POA: Diagnosis not present

## 2023-03-14 DIAGNOSIS — M62562 Muscle wasting and atrophy, not elsewhere classified, left lower leg: Secondary | ICD-10-CM | POA: Diagnosis not present

## 2023-03-14 DIAGNOSIS — R4789 Other speech disturbances: Secondary | ICD-10-CM | POA: Diagnosis not present

## 2023-03-14 DIAGNOSIS — M62521 Muscle wasting and atrophy, not elsewhere classified, right upper arm: Secondary | ICD-10-CM | POA: Diagnosis not present

## 2023-03-15 DIAGNOSIS — R2689 Other abnormalities of gait and mobility: Secondary | ICD-10-CM | POA: Diagnosis not present

## 2023-03-15 DIAGNOSIS — R4789 Other speech disturbances: Secondary | ICD-10-CM | POA: Diagnosis not present

## 2023-03-15 DIAGNOSIS — M62561 Muscle wasting and atrophy, not elsewhere classified, right lower leg: Secondary | ICD-10-CM | POA: Diagnosis not present

## 2023-03-15 DIAGNOSIS — M62562 Muscle wasting and atrophy, not elsewhere classified, left lower leg: Secondary | ICD-10-CM | POA: Diagnosis not present

## 2023-03-15 DIAGNOSIS — S32030S Wedge compression fracture of third lumbar vertebra, sequela: Secondary | ICD-10-CM | POA: Diagnosis not present

## 2023-03-15 DIAGNOSIS — M62521 Muscle wasting and atrophy, not elsewhere classified, right upper arm: Secondary | ICD-10-CM | POA: Diagnosis not present

## 2023-03-15 DIAGNOSIS — M62522 Muscle wasting and atrophy, not elsewhere classified, left upper arm: Secondary | ICD-10-CM | POA: Diagnosis not present

## 2023-03-15 DIAGNOSIS — S32030D Wedge compression fracture of third lumbar vertebra, subsequent encounter for fracture with routine healing: Secondary | ICD-10-CM | POA: Diagnosis not present

## 2023-03-17 DIAGNOSIS — S32030D Wedge compression fracture of third lumbar vertebra, subsequent encounter for fracture with routine healing: Secondary | ICD-10-CM | POA: Diagnosis not present

## 2023-03-17 DIAGNOSIS — M62522 Muscle wasting and atrophy, not elsewhere classified, left upper arm: Secondary | ICD-10-CM | POA: Diagnosis not present

## 2023-03-17 DIAGNOSIS — M62561 Muscle wasting and atrophy, not elsewhere classified, right lower leg: Secondary | ICD-10-CM | POA: Diagnosis not present

## 2023-03-17 DIAGNOSIS — M62562 Muscle wasting and atrophy, not elsewhere classified, left lower leg: Secondary | ICD-10-CM | POA: Diagnosis not present

## 2023-03-17 DIAGNOSIS — M62521 Muscle wasting and atrophy, not elsewhere classified, right upper arm: Secondary | ICD-10-CM | POA: Diagnosis not present

## 2023-03-17 DIAGNOSIS — S32030S Wedge compression fracture of third lumbar vertebra, sequela: Secondary | ICD-10-CM | POA: Diagnosis not present

## 2023-03-17 DIAGNOSIS — R4789 Other speech disturbances: Secondary | ICD-10-CM | POA: Diagnosis not present

## 2023-03-17 DIAGNOSIS — R2689 Other abnormalities of gait and mobility: Secondary | ICD-10-CM | POA: Diagnosis not present

## 2023-03-21 DIAGNOSIS — R4789 Other speech disturbances: Secondary | ICD-10-CM | POA: Diagnosis not present

## 2023-03-21 DIAGNOSIS — S32030S Wedge compression fracture of third lumbar vertebra, sequela: Secondary | ICD-10-CM | POA: Diagnosis not present

## 2023-03-21 DIAGNOSIS — M62522 Muscle wasting and atrophy, not elsewhere classified, left upper arm: Secondary | ICD-10-CM | POA: Diagnosis not present

## 2023-03-21 DIAGNOSIS — S32030D Wedge compression fracture of third lumbar vertebra, subsequent encounter for fracture with routine healing: Secondary | ICD-10-CM | POA: Diagnosis not present

## 2023-03-21 DIAGNOSIS — R2689 Other abnormalities of gait and mobility: Secondary | ICD-10-CM | POA: Diagnosis not present

## 2023-03-21 DIAGNOSIS — M62561 Muscle wasting and atrophy, not elsewhere classified, right lower leg: Secondary | ICD-10-CM | POA: Diagnosis not present

## 2023-03-21 DIAGNOSIS — M62562 Muscle wasting and atrophy, not elsewhere classified, left lower leg: Secondary | ICD-10-CM | POA: Diagnosis not present

## 2023-03-21 DIAGNOSIS — M62521 Muscle wasting and atrophy, not elsewhere classified, right upper arm: Secondary | ICD-10-CM | POA: Diagnosis not present

## 2023-03-22 DIAGNOSIS — R4789 Other speech disturbances: Secondary | ICD-10-CM | POA: Diagnosis not present

## 2023-03-22 DIAGNOSIS — M62562 Muscle wasting and atrophy, not elsewhere classified, left lower leg: Secondary | ICD-10-CM | POA: Diagnosis not present

## 2023-03-22 DIAGNOSIS — M62522 Muscle wasting and atrophy, not elsewhere classified, left upper arm: Secondary | ICD-10-CM | POA: Diagnosis not present

## 2023-03-22 DIAGNOSIS — S32030D Wedge compression fracture of third lumbar vertebra, subsequent encounter for fracture with routine healing: Secondary | ICD-10-CM | POA: Diagnosis not present

## 2023-03-22 DIAGNOSIS — R2689 Other abnormalities of gait and mobility: Secondary | ICD-10-CM | POA: Diagnosis not present

## 2023-03-22 DIAGNOSIS — S32030S Wedge compression fracture of third lumbar vertebra, sequela: Secondary | ICD-10-CM | POA: Diagnosis not present

## 2023-03-22 DIAGNOSIS — M62561 Muscle wasting and atrophy, not elsewhere classified, right lower leg: Secondary | ICD-10-CM | POA: Diagnosis not present

## 2023-03-22 DIAGNOSIS — M62521 Muscle wasting and atrophy, not elsewhere classified, right upper arm: Secondary | ICD-10-CM | POA: Diagnosis not present

## 2023-03-23 DIAGNOSIS — M62521 Muscle wasting and atrophy, not elsewhere classified, right upper arm: Secondary | ICD-10-CM | POA: Diagnosis not present

## 2023-03-23 DIAGNOSIS — R2689 Other abnormalities of gait and mobility: Secondary | ICD-10-CM | POA: Diagnosis not present

## 2023-03-23 DIAGNOSIS — S32030D Wedge compression fracture of third lumbar vertebra, subsequent encounter for fracture with routine healing: Secondary | ICD-10-CM | POA: Diagnosis not present

## 2023-03-23 DIAGNOSIS — M62562 Muscle wasting and atrophy, not elsewhere classified, left lower leg: Secondary | ICD-10-CM | POA: Diagnosis not present

## 2023-03-23 DIAGNOSIS — R4789 Other speech disturbances: Secondary | ICD-10-CM | POA: Diagnosis not present

## 2023-03-23 DIAGNOSIS — M62522 Muscle wasting and atrophy, not elsewhere classified, left upper arm: Secondary | ICD-10-CM | POA: Diagnosis not present

## 2023-03-23 DIAGNOSIS — S32030S Wedge compression fracture of third lumbar vertebra, sequela: Secondary | ICD-10-CM | POA: Diagnosis not present

## 2023-03-23 DIAGNOSIS — M62561 Muscle wasting and atrophy, not elsewhere classified, right lower leg: Secondary | ICD-10-CM | POA: Diagnosis not present

## 2023-03-24 DIAGNOSIS — M62521 Muscle wasting and atrophy, not elsewhere classified, right upper arm: Secondary | ICD-10-CM | POA: Diagnosis not present

## 2023-03-24 DIAGNOSIS — R4789 Other speech disturbances: Secondary | ICD-10-CM | POA: Diagnosis not present

## 2023-03-24 DIAGNOSIS — M62522 Muscle wasting and atrophy, not elsewhere classified, left upper arm: Secondary | ICD-10-CM | POA: Diagnosis not present

## 2023-03-24 DIAGNOSIS — M62562 Muscle wasting and atrophy, not elsewhere classified, left lower leg: Secondary | ICD-10-CM | POA: Diagnosis not present

## 2023-03-24 DIAGNOSIS — M62561 Muscle wasting and atrophy, not elsewhere classified, right lower leg: Secondary | ICD-10-CM | POA: Diagnosis not present

## 2023-03-24 DIAGNOSIS — R2689 Other abnormalities of gait and mobility: Secondary | ICD-10-CM | POA: Diagnosis not present

## 2023-03-24 DIAGNOSIS — S32030D Wedge compression fracture of third lumbar vertebra, subsequent encounter for fracture with routine healing: Secondary | ICD-10-CM | POA: Diagnosis not present

## 2023-03-24 DIAGNOSIS — S32030S Wedge compression fracture of third lumbar vertebra, sequela: Secondary | ICD-10-CM | POA: Diagnosis not present

## 2023-03-27 DIAGNOSIS — M62561 Muscle wasting and atrophy, not elsewhere classified, right lower leg: Secondary | ICD-10-CM | POA: Diagnosis not present

## 2023-03-27 DIAGNOSIS — S32030S Wedge compression fracture of third lumbar vertebra, sequela: Secondary | ICD-10-CM | POA: Diagnosis not present

## 2023-03-27 DIAGNOSIS — M62521 Muscle wasting and atrophy, not elsewhere classified, right upper arm: Secondary | ICD-10-CM | POA: Diagnosis not present

## 2023-03-27 DIAGNOSIS — S32030D Wedge compression fracture of third lumbar vertebra, subsequent encounter for fracture with routine healing: Secondary | ICD-10-CM | POA: Diagnosis not present

## 2023-03-27 DIAGNOSIS — R4789 Other speech disturbances: Secondary | ICD-10-CM | POA: Diagnosis not present

## 2023-03-27 DIAGNOSIS — R2689 Other abnormalities of gait and mobility: Secondary | ICD-10-CM | POA: Diagnosis not present

## 2023-03-27 DIAGNOSIS — M62562 Muscle wasting and atrophy, not elsewhere classified, left lower leg: Secondary | ICD-10-CM | POA: Diagnosis not present

## 2023-03-27 DIAGNOSIS — M62522 Muscle wasting and atrophy, not elsewhere classified, left upper arm: Secondary | ICD-10-CM | POA: Diagnosis not present

## 2023-03-28 DIAGNOSIS — S32030D Wedge compression fracture of third lumbar vertebra, subsequent encounter for fracture with routine healing: Secondary | ICD-10-CM | POA: Diagnosis not present

## 2023-03-28 DIAGNOSIS — E1169 Type 2 diabetes mellitus with other specified complication: Secondary | ICD-10-CM | POA: Diagnosis not present

## 2023-03-28 DIAGNOSIS — R4789 Other speech disturbances: Secondary | ICD-10-CM | POA: Diagnosis not present

## 2023-03-28 DIAGNOSIS — I10 Essential (primary) hypertension: Secondary | ICD-10-CM | POA: Diagnosis not present

## 2023-03-28 DIAGNOSIS — E7849 Other hyperlipidemia: Secondary | ICD-10-CM | POA: Diagnosis not present

## 2023-03-28 DIAGNOSIS — M62522 Muscle wasting and atrophy, not elsewhere classified, left upper arm: Secondary | ICD-10-CM | POA: Diagnosis not present

## 2023-03-28 DIAGNOSIS — M62562 Muscle wasting and atrophy, not elsewhere classified, left lower leg: Secondary | ICD-10-CM | POA: Diagnosis not present

## 2023-03-28 DIAGNOSIS — M62561 Muscle wasting and atrophy, not elsewhere classified, right lower leg: Secondary | ICD-10-CM | POA: Diagnosis not present

## 2023-03-28 DIAGNOSIS — M62521 Muscle wasting and atrophy, not elsewhere classified, right upper arm: Secondary | ICD-10-CM | POA: Diagnosis not present

## 2023-03-28 DIAGNOSIS — R2689 Other abnormalities of gait and mobility: Secondary | ICD-10-CM | POA: Diagnosis not present

## 2023-03-28 DIAGNOSIS — S32030S Wedge compression fracture of third lumbar vertebra, sequela: Secondary | ICD-10-CM | POA: Diagnosis not present

## 2023-03-29 DIAGNOSIS — M62562 Muscle wasting and atrophy, not elsewhere classified, left lower leg: Secondary | ICD-10-CM | POA: Diagnosis not present

## 2023-03-29 DIAGNOSIS — R2689 Other abnormalities of gait and mobility: Secondary | ICD-10-CM | POA: Diagnosis not present

## 2023-03-29 DIAGNOSIS — M62521 Muscle wasting and atrophy, not elsewhere classified, right upper arm: Secondary | ICD-10-CM | POA: Diagnosis not present

## 2023-03-29 DIAGNOSIS — S32030D Wedge compression fracture of third lumbar vertebra, subsequent encounter for fracture with routine healing: Secondary | ICD-10-CM | POA: Diagnosis not present

## 2023-03-29 DIAGNOSIS — S32030S Wedge compression fracture of third lumbar vertebra, sequela: Secondary | ICD-10-CM | POA: Diagnosis not present

## 2023-03-29 DIAGNOSIS — R4789 Other speech disturbances: Secondary | ICD-10-CM | POA: Diagnosis not present

## 2023-03-29 DIAGNOSIS — M62561 Muscle wasting and atrophy, not elsewhere classified, right lower leg: Secondary | ICD-10-CM | POA: Diagnosis not present

## 2023-03-29 DIAGNOSIS — M62522 Muscle wasting and atrophy, not elsewhere classified, left upper arm: Secondary | ICD-10-CM | POA: Diagnosis not present

## 2023-03-30 DIAGNOSIS — M62522 Muscle wasting and atrophy, not elsewhere classified, left upper arm: Secondary | ICD-10-CM | POA: Diagnosis not present

## 2023-03-30 DIAGNOSIS — M62561 Muscle wasting and atrophy, not elsewhere classified, right lower leg: Secondary | ICD-10-CM | POA: Diagnosis not present

## 2023-03-30 DIAGNOSIS — M62562 Muscle wasting and atrophy, not elsewhere classified, left lower leg: Secondary | ICD-10-CM | POA: Diagnosis not present

## 2023-03-30 DIAGNOSIS — M62521 Muscle wasting and atrophy, not elsewhere classified, right upper arm: Secondary | ICD-10-CM | POA: Diagnosis not present

## 2023-03-30 DIAGNOSIS — S32030D Wedge compression fracture of third lumbar vertebra, subsequent encounter for fracture with routine healing: Secondary | ICD-10-CM | POA: Diagnosis not present

## 2023-03-30 DIAGNOSIS — R4789 Other speech disturbances: Secondary | ICD-10-CM | POA: Diagnosis not present

## 2023-03-30 DIAGNOSIS — R2689 Other abnormalities of gait and mobility: Secondary | ICD-10-CM | POA: Diagnosis not present

## 2023-03-30 DIAGNOSIS — S32030S Wedge compression fracture of third lumbar vertebra, sequela: Secondary | ICD-10-CM | POA: Diagnosis not present

## 2023-04-26 DIAGNOSIS — I10 Essential (primary) hypertension: Secondary | ICD-10-CM | POA: Diagnosis not present

## 2023-04-26 DIAGNOSIS — E7849 Other hyperlipidemia: Secondary | ICD-10-CM | POA: Diagnosis not present

## 2023-04-26 DIAGNOSIS — E1169 Type 2 diabetes mellitus with other specified complication: Secondary | ICD-10-CM | POA: Diagnosis not present

## 2023-05-24 DIAGNOSIS — E1169 Type 2 diabetes mellitus with other specified complication: Secondary | ICD-10-CM | POA: Diagnosis not present

## 2023-05-24 DIAGNOSIS — E7849 Other hyperlipidemia: Secondary | ICD-10-CM | POA: Diagnosis not present

## 2023-05-24 DIAGNOSIS — I10 Essential (primary) hypertension: Secondary | ICD-10-CM | POA: Diagnosis not present

## 2023-05-31 DIAGNOSIS — M62561 Muscle wasting and atrophy, not elsewhere classified, right lower leg: Secondary | ICD-10-CM | POA: Diagnosis not present

## 2023-05-31 DIAGNOSIS — M62562 Muscle wasting and atrophy, not elsewhere classified, left lower leg: Secondary | ICD-10-CM | POA: Diagnosis not present

## 2023-05-31 DIAGNOSIS — R2689 Other abnormalities of gait and mobility: Secondary | ICD-10-CM | POA: Diagnosis not present

## 2023-06-01 DIAGNOSIS — M62561 Muscle wasting and atrophy, not elsewhere classified, right lower leg: Secondary | ICD-10-CM | POA: Diagnosis not present

## 2023-06-01 DIAGNOSIS — R2689 Other abnormalities of gait and mobility: Secondary | ICD-10-CM | POA: Diagnosis not present

## 2023-06-01 DIAGNOSIS — M62562 Muscle wasting and atrophy, not elsewhere classified, left lower leg: Secondary | ICD-10-CM | POA: Diagnosis not present

## 2023-06-02 DIAGNOSIS — M62561 Muscle wasting and atrophy, not elsewhere classified, right lower leg: Secondary | ICD-10-CM | POA: Diagnosis not present

## 2023-06-02 DIAGNOSIS — M62562 Muscle wasting and atrophy, not elsewhere classified, left lower leg: Secondary | ICD-10-CM | POA: Diagnosis not present

## 2023-06-02 DIAGNOSIS — R2689 Other abnormalities of gait and mobility: Secondary | ICD-10-CM | POA: Diagnosis not present

## 2023-06-03 DIAGNOSIS — M62562 Muscle wasting and atrophy, not elsewhere classified, left lower leg: Secondary | ICD-10-CM | POA: Diagnosis not present

## 2023-06-03 DIAGNOSIS — M62561 Muscle wasting and atrophy, not elsewhere classified, right lower leg: Secondary | ICD-10-CM | POA: Diagnosis not present

## 2023-06-03 DIAGNOSIS — R2689 Other abnormalities of gait and mobility: Secondary | ICD-10-CM | POA: Diagnosis not present

## 2023-06-05 DIAGNOSIS — M62561 Muscle wasting and atrophy, not elsewhere classified, right lower leg: Secondary | ICD-10-CM | POA: Diagnosis not present

## 2023-06-05 DIAGNOSIS — R41841 Cognitive communication deficit: Secondary | ICD-10-CM | POA: Diagnosis not present

## 2023-06-05 DIAGNOSIS — M62562 Muscle wasting and atrophy, not elsewhere classified, left lower leg: Secondary | ICD-10-CM | POA: Diagnosis not present

## 2023-06-05 DIAGNOSIS — R2689 Other abnormalities of gait and mobility: Secondary | ICD-10-CM | POA: Diagnosis not present

## 2023-06-06 DIAGNOSIS — R2689 Other abnormalities of gait and mobility: Secondary | ICD-10-CM | POA: Diagnosis not present

## 2023-06-06 DIAGNOSIS — M62562 Muscle wasting and atrophy, not elsewhere classified, left lower leg: Secondary | ICD-10-CM | POA: Diagnosis not present

## 2023-06-06 DIAGNOSIS — M62561 Muscle wasting and atrophy, not elsewhere classified, right lower leg: Secondary | ICD-10-CM | POA: Diagnosis not present

## 2023-06-06 DIAGNOSIS — R41841 Cognitive communication deficit: Secondary | ICD-10-CM | POA: Diagnosis not present

## 2023-06-07 DIAGNOSIS — R2689 Other abnormalities of gait and mobility: Secondary | ICD-10-CM | POA: Diagnosis not present

## 2023-06-07 DIAGNOSIS — M62562 Muscle wasting and atrophy, not elsewhere classified, left lower leg: Secondary | ICD-10-CM | POA: Diagnosis not present

## 2023-06-07 DIAGNOSIS — M62561 Muscle wasting and atrophy, not elsewhere classified, right lower leg: Secondary | ICD-10-CM | POA: Diagnosis not present

## 2023-06-07 DIAGNOSIS — R41841 Cognitive communication deficit: Secondary | ICD-10-CM | POA: Diagnosis not present

## 2023-06-08 DIAGNOSIS — M62561 Muscle wasting and atrophy, not elsewhere classified, right lower leg: Secondary | ICD-10-CM | POA: Diagnosis not present

## 2023-06-08 DIAGNOSIS — R2689 Other abnormalities of gait and mobility: Secondary | ICD-10-CM | POA: Diagnosis not present

## 2023-06-08 DIAGNOSIS — M62562 Muscle wasting and atrophy, not elsewhere classified, left lower leg: Secondary | ICD-10-CM | POA: Diagnosis not present

## 2023-06-08 DIAGNOSIS — R41841 Cognitive communication deficit: Secondary | ICD-10-CM | POA: Diagnosis not present

## 2023-06-12 DIAGNOSIS — M62561 Muscle wasting and atrophy, not elsewhere classified, right lower leg: Secondary | ICD-10-CM | POA: Diagnosis not present

## 2023-06-12 DIAGNOSIS — R2689 Other abnormalities of gait and mobility: Secondary | ICD-10-CM | POA: Diagnosis not present

## 2023-06-12 DIAGNOSIS — M62562 Muscle wasting and atrophy, not elsewhere classified, left lower leg: Secondary | ICD-10-CM | POA: Diagnosis not present

## 2023-06-12 DIAGNOSIS — R41841 Cognitive communication deficit: Secondary | ICD-10-CM | POA: Diagnosis not present

## 2023-06-13 DIAGNOSIS — M62562 Muscle wasting and atrophy, not elsewhere classified, left lower leg: Secondary | ICD-10-CM | POA: Diagnosis not present

## 2023-06-13 DIAGNOSIS — R41841 Cognitive communication deficit: Secondary | ICD-10-CM | POA: Diagnosis not present

## 2023-06-13 DIAGNOSIS — R2689 Other abnormalities of gait and mobility: Secondary | ICD-10-CM | POA: Diagnosis not present

## 2023-06-13 DIAGNOSIS — M62561 Muscle wasting and atrophy, not elsewhere classified, right lower leg: Secondary | ICD-10-CM | POA: Diagnosis not present

## 2023-06-14 DIAGNOSIS — M62561 Muscle wasting and atrophy, not elsewhere classified, right lower leg: Secondary | ICD-10-CM | POA: Diagnosis not present

## 2023-06-14 DIAGNOSIS — M62562 Muscle wasting and atrophy, not elsewhere classified, left lower leg: Secondary | ICD-10-CM | POA: Diagnosis not present

## 2023-06-14 DIAGNOSIS — R2689 Other abnormalities of gait and mobility: Secondary | ICD-10-CM | POA: Diagnosis not present

## 2023-06-14 DIAGNOSIS — R41841 Cognitive communication deficit: Secondary | ICD-10-CM | POA: Diagnosis not present

## 2023-06-15 DIAGNOSIS — M62562 Muscle wasting and atrophy, not elsewhere classified, left lower leg: Secondary | ICD-10-CM | POA: Diagnosis not present

## 2023-06-15 DIAGNOSIS — M62561 Muscle wasting and atrophy, not elsewhere classified, right lower leg: Secondary | ICD-10-CM | POA: Diagnosis not present

## 2023-06-15 DIAGNOSIS — R2689 Other abnormalities of gait and mobility: Secondary | ICD-10-CM | POA: Diagnosis not present

## 2023-06-15 DIAGNOSIS — R41841 Cognitive communication deficit: Secondary | ICD-10-CM | POA: Diagnosis not present

## 2023-06-16 DIAGNOSIS — R2689 Other abnormalities of gait and mobility: Secondary | ICD-10-CM | POA: Diagnosis not present

## 2023-06-16 DIAGNOSIS — R41841 Cognitive communication deficit: Secondary | ICD-10-CM | POA: Diagnosis not present

## 2023-06-16 DIAGNOSIS — M62562 Muscle wasting and atrophy, not elsewhere classified, left lower leg: Secondary | ICD-10-CM | POA: Diagnosis not present

## 2023-06-16 DIAGNOSIS — M62561 Muscle wasting and atrophy, not elsewhere classified, right lower leg: Secondary | ICD-10-CM | POA: Diagnosis not present

## 2023-06-19 DIAGNOSIS — M62562 Muscle wasting and atrophy, not elsewhere classified, left lower leg: Secondary | ICD-10-CM | POA: Diagnosis not present

## 2023-06-19 DIAGNOSIS — M62561 Muscle wasting and atrophy, not elsewhere classified, right lower leg: Secondary | ICD-10-CM | POA: Diagnosis not present

## 2023-06-19 DIAGNOSIS — R41841 Cognitive communication deficit: Secondary | ICD-10-CM | POA: Diagnosis not present

## 2023-06-19 DIAGNOSIS — R2689 Other abnormalities of gait and mobility: Secondary | ICD-10-CM | POA: Diagnosis not present

## 2023-06-20 DIAGNOSIS — M62562 Muscle wasting and atrophy, not elsewhere classified, left lower leg: Secondary | ICD-10-CM | POA: Diagnosis not present

## 2023-06-20 DIAGNOSIS — R2689 Other abnormalities of gait and mobility: Secondary | ICD-10-CM | POA: Diagnosis not present

## 2023-06-20 DIAGNOSIS — R41841 Cognitive communication deficit: Secondary | ICD-10-CM | POA: Diagnosis not present

## 2023-06-20 DIAGNOSIS — M62561 Muscle wasting and atrophy, not elsewhere classified, right lower leg: Secondary | ICD-10-CM | POA: Diagnosis not present

## 2023-06-21 DIAGNOSIS — M62562 Muscle wasting and atrophy, not elsewhere classified, left lower leg: Secondary | ICD-10-CM | POA: Diagnosis not present

## 2023-06-21 DIAGNOSIS — R2689 Other abnormalities of gait and mobility: Secondary | ICD-10-CM | POA: Diagnosis not present

## 2023-06-21 DIAGNOSIS — M62561 Muscle wasting and atrophy, not elsewhere classified, right lower leg: Secondary | ICD-10-CM | POA: Diagnosis not present

## 2023-06-21 DIAGNOSIS — R41841 Cognitive communication deficit: Secondary | ICD-10-CM | POA: Diagnosis not present

## 2023-06-22 DIAGNOSIS — M62561 Muscle wasting and atrophy, not elsewhere classified, right lower leg: Secondary | ICD-10-CM | POA: Diagnosis not present

## 2023-06-22 DIAGNOSIS — R2689 Other abnormalities of gait and mobility: Secondary | ICD-10-CM | POA: Diagnosis not present

## 2023-06-22 DIAGNOSIS — R41841 Cognitive communication deficit: Secondary | ICD-10-CM | POA: Diagnosis not present

## 2023-06-22 DIAGNOSIS — M62562 Muscle wasting and atrophy, not elsewhere classified, left lower leg: Secondary | ICD-10-CM | POA: Diagnosis not present

## 2023-06-23 DIAGNOSIS — R2689 Other abnormalities of gait and mobility: Secondary | ICD-10-CM | POA: Diagnosis not present

## 2023-06-23 DIAGNOSIS — M62562 Muscle wasting and atrophy, not elsewhere classified, left lower leg: Secondary | ICD-10-CM | POA: Diagnosis not present

## 2023-06-23 DIAGNOSIS — R41841 Cognitive communication deficit: Secondary | ICD-10-CM | POA: Diagnosis not present

## 2023-06-23 DIAGNOSIS — M62561 Muscle wasting and atrophy, not elsewhere classified, right lower leg: Secondary | ICD-10-CM | POA: Diagnosis not present

## 2023-06-26 DIAGNOSIS — R2689 Other abnormalities of gait and mobility: Secondary | ICD-10-CM | POA: Diagnosis not present

## 2023-06-26 DIAGNOSIS — R41841 Cognitive communication deficit: Secondary | ICD-10-CM | POA: Diagnosis not present

## 2023-06-26 DIAGNOSIS — M62562 Muscle wasting and atrophy, not elsewhere classified, left lower leg: Secondary | ICD-10-CM | POA: Diagnosis not present

## 2023-06-26 DIAGNOSIS — M62561 Muscle wasting and atrophy, not elsewhere classified, right lower leg: Secondary | ICD-10-CM | POA: Diagnosis not present

## 2023-06-27 DIAGNOSIS — R41841 Cognitive communication deficit: Secondary | ICD-10-CM | POA: Diagnosis not present

## 2023-06-27 DIAGNOSIS — I10 Essential (primary) hypertension: Secondary | ICD-10-CM | POA: Diagnosis not present

## 2023-06-27 DIAGNOSIS — M62562 Muscle wasting and atrophy, not elsewhere classified, left lower leg: Secondary | ICD-10-CM | POA: Diagnosis not present

## 2023-06-27 DIAGNOSIS — M62561 Muscle wasting and atrophy, not elsewhere classified, right lower leg: Secondary | ICD-10-CM | POA: Diagnosis not present

## 2023-06-27 DIAGNOSIS — R2689 Other abnormalities of gait and mobility: Secondary | ICD-10-CM | POA: Diagnosis not present

## 2023-06-27 DIAGNOSIS — E1169 Type 2 diabetes mellitus with other specified complication: Secondary | ICD-10-CM | POA: Diagnosis not present

## 2023-06-27 DIAGNOSIS — E7849 Other hyperlipidemia: Secondary | ICD-10-CM | POA: Diagnosis not present

## 2023-06-28 DIAGNOSIS — M62562 Muscle wasting and atrophy, not elsewhere classified, left lower leg: Secondary | ICD-10-CM | POA: Diagnosis not present

## 2023-06-28 DIAGNOSIS — R2689 Other abnormalities of gait and mobility: Secondary | ICD-10-CM | POA: Diagnosis not present

## 2023-06-28 DIAGNOSIS — R41841 Cognitive communication deficit: Secondary | ICD-10-CM | POA: Diagnosis not present

## 2023-06-28 DIAGNOSIS — M62561 Muscle wasting and atrophy, not elsewhere classified, right lower leg: Secondary | ICD-10-CM | POA: Diagnosis not present

## 2023-06-29 DIAGNOSIS — R41841 Cognitive communication deficit: Secondary | ICD-10-CM | POA: Diagnosis not present

## 2023-06-29 DIAGNOSIS — M62562 Muscle wasting and atrophy, not elsewhere classified, left lower leg: Secondary | ICD-10-CM | POA: Diagnosis not present

## 2023-06-29 DIAGNOSIS — M62561 Muscle wasting and atrophy, not elsewhere classified, right lower leg: Secondary | ICD-10-CM | POA: Diagnosis not present

## 2023-06-29 DIAGNOSIS — R2689 Other abnormalities of gait and mobility: Secondary | ICD-10-CM | POA: Diagnosis not present

## 2023-07-01 DIAGNOSIS — R2689 Other abnormalities of gait and mobility: Secondary | ICD-10-CM | POA: Diagnosis not present

## 2023-07-01 DIAGNOSIS — M62561 Muscle wasting and atrophy, not elsewhere classified, right lower leg: Secondary | ICD-10-CM | POA: Diagnosis not present

## 2023-07-01 DIAGNOSIS — M62562 Muscle wasting and atrophy, not elsewhere classified, left lower leg: Secondary | ICD-10-CM | POA: Diagnosis not present

## 2023-07-01 DIAGNOSIS — R41841 Cognitive communication deficit: Secondary | ICD-10-CM | POA: Diagnosis not present

## 2023-07-03 DIAGNOSIS — L602 Onychogryphosis: Secondary | ICD-10-CM | POA: Diagnosis not present

## 2023-07-03 DIAGNOSIS — L603 Nail dystrophy: Secondary | ICD-10-CM | POA: Diagnosis not present

## 2023-07-03 DIAGNOSIS — I739 Peripheral vascular disease, unspecified: Secondary | ICD-10-CM | POA: Diagnosis not present

## 2023-07-04 DIAGNOSIS — M62561 Muscle wasting and atrophy, not elsewhere classified, right lower leg: Secondary | ICD-10-CM | POA: Diagnosis not present

## 2023-07-04 DIAGNOSIS — R41841 Cognitive communication deficit: Secondary | ICD-10-CM | POA: Diagnosis not present

## 2023-07-04 DIAGNOSIS — R2689 Other abnormalities of gait and mobility: Secondary | ICD-10-CM | POA: Diagnosis not present

## 2023-07-05 DIAGNOSIS — R41841 Cognitive communication deficit: Secondary | ICD-10-CM | POA: Diagnosis not present

## 2023-07-05 DIAGNOSIS — R2689 Other abnormalities of gait and mobility: Secondary | ICD-10-CM | POA: Diagnosis not present

## 2023-07-05 DIAGNOSIS — M62561 Muscle wasting and atrophy, not elsewhere classified, right lower leg: Secondary | ICD-10-CM | POA: Diagnosis not present

## 2023-07-06 DIAGNOSIS — R2689 Other abnormalities of gait and mobility: Secondary | ICD-10-CM | POA: Diagnosis not present

## 2023-07-06 DIAGNOSIS — R41841 Cognitive communication deficit: Secondary | ICD-10-CM | POA: Diagnosis not present

## 2023-07-06 DIAGNOSIS — M62561 Muscle wasting and atrophy, not elsewhere classified, right lower leg: Secondary | ICD-10-CM | POA: Diagnosis not present

## 2023-07-07 DIAGNOSIS — M62561 Muscle wasting and atrophy, not elsewhere classified, right lower leg: Secondary | ICD-10-CM | POA: Diagnosis not present

## 2023-07-07 DIAGNOSIS — R2689 Other abnormalities of gait and mobility: Secondary | ICD-10-CM | POA: Diagnosis not present

## 2023-07-07 DIAGNOSIS — R41841 Cognitive communication deficit: Secondary | ICD-10-CM | POA: Diagnosis not present

## 2023-07-11 DIAGNOSIS — R41841 Cognitive communication deficit: Secondary | ICD-10-CM | POA: Diagnosis not present

## 2023-07-11 DIAGNOSIS — R2689 Other abnormalities of gait and mobility: Secondary | ICD-10-CM | POA: Diagnosis not present

## 2023-07-11 DIAGNOSIS — M62561 Muscle wasting and atrophy, not elsewhere classified, right lower leg: Secondary | ICD-10-CM | POA: Diagnosis not present

## 2023-07-12 DIAGNOSIS — M62561 Muscle wasting and atrophy, not elsewhere classified, right lower leg: Secondary | ICD-10-CM | POA: Diagnosis not present

## 2023-07-12 DIAGNOSIS — R2689 Other abnormalities of gait and mobility: Secondary | ICD-10-CM | POA: Diagnosis not present

## 2023-07-12 DIAGNOSIS — R41841 Cognitive communication deficit: Secondary | ICD-10-CM | POA: Diagnosis not present

## 2023-07-13 DIAGNOSIS — R2689 Other abnormalities of gait and mobility: Secondary | ICD-10-CM | POA: Diagnosis not present

## 2023-07-13 DIAGNOSIS — M62561 Muscle wasting and atrophy, not elsewhere classified, right lower leg: Secondary | ICD-10-CM | POA: Diagnosis not present

## 2023-07-13 DIAGNOSIS — R41841 Cognitive communication deficit: Secondary | ICD-10-CM | POA: Diagnosis not present

## 2023-07-14 DIAGNOSIS — M62561 Muscle wasting and atrophy, not elsewhere classified, right lower leg: Secondary | ICD-10-CM | POA: Diagnosis not present

## 2023-07-14 DIAGNOSIS — R2689 Other abnormalities of gait and mobility: Secondary | ICD-10-CM | POA: Diagnosis not present

## 2023-07-14 DIAGNOSIS — R41841 Cognitive communication deficit: Secondary | ICD-10-CM | POA: Diagnosis not present

## 2023-07-21 DIAGNOSIS — R2689 Other abnormalities of gait and mobility: Secondary | ICD-10-CM | POA: Diagnosis not present

## 2023-07-21 DIAGNOSIS — R41841 Cognitive communication deficit: Secondary | ICD-10-CM | POA: Diagnosis not present

## 2023-07-21 DIAGNOSIS — M62561 Muscle wasting and atrophy, not elsewhere classified, right lower leg: Secondary | ICD-10-CM | POA: Diagnosis not present

## 2023-07-22 DIAGNOSIS — R2689 Other abnormalities of gait and mobility: Secondary | ICD-10-CM | POA: Diagnosis not present

## 2023-07-22 DIAGNOSIS — M62561 Muscle wasting and atrophy, not elsewhere classified, right lower leg: Secondary | ICD-10-CM | POA: Diagnosis not present

## 2023-07-22 DIAGNOSIS — R41841 Cognitive communication deficit: Secondary | ICD-10-CM | POA: Diagnosis not present

## 2023-07-24 DIAGNOSIS — R41841 Cognitive communication deficit: Secondary | ICD-10-CM | POA: Diagnosis not present

## 2023-07-24 DIAGNOSIS — M62561 Muscle wasting and atrophy, not elsewhere classified, right lower leg: Secondary | ICD-10-CM | POA: Diagnosis not present

## 2023-07-24 DIAGNOSIS — R2689 Other abnormalities of gait and mobility: Secondary | ICD-10-CM | POA: Diagnosis not present

## 2023-07-25 DIAGNOSIS — R41841 Cognitive communication deficit: Secondary | ICD-10-CM | POA: Diagnosis not present

## 2023-07-25 DIAGNOSIS — R2689 Other abnormalities of gait and mobility: Secondary | ICD-10-CM | POA: Diagnosis not present

## 2023-07-25 DIAGNOSIS — M62561 Muscle wasting and atrophy, not elsewhere classified, right lower leg: Secondary | ICD-10-CM | POA: Diagnosis not present

## 2023-07-26 DIAGNOSIS — M62561 Muscle wasting and atrophy, not elsewhere classified, right lower leg: Secondary | ICD-10-CM | POA: Diagnosis not present

## 2023-07-26 DIAGNOSIS — R41841 Cognitive communication deficit: Secondary | ICD-10-CM | POA: Diagnosis not present

## 2023-07-26 DIAGNOSIS — R2689 Other abnormalities of gait and mobility: Secondary | ICD-10-CM | POA: Diagnosis not present

## 2023-07-27 DIAGNOSIS — R41841 Cognitive communication deficit: Secondary | ICD-10-CM | POA: Diagnosis not present

## 2023-07-27 DIAGNOSIS — R2689 Other abnormalities of gait and mobility: Secondary | ICD-10-CM | POA: Diagnosis not present

## 2023-07-27 DIAGNOSIS — E7849 Other hyperlipidemia: Secondary | ICD-10-CM | POA: Diagnosis not present

## 2023-07-27 DIAGNOSIS — I10 Essential (primary) hypertension: Secondary | ICD-10-CM | POA: Diagnosis not present

## 2023-07-27 DIAGNOSIS — M62561 Muscle wasting and atrophy, not elsewhere classified, right lower leg: Secondary | ICD-10-CM | POA: Diagnosis not present

## 2023-07-27 DIAGNOSIS — E1169 Type 2 diabetes mellitus with other specified complication: Secondary | ICD-10-CM | POA: Diagnosis not present

## 2023-07-31 DIAGNOSIS — M62561 Muscle wasting and atrophy, not elsewhere classified, right lower leg: Secondary | ICD-10-CM | POA: Diagnosis not present

## 2023-07-31 DIAGNOSIS — R41841 Cognitive communication deficit: Secondary | ICD-10-CM | POA: Diagnosis not present

## 2023-07-31 DIAGNOSIS — R2689 Other abnormalities of gait and mobility: Secondary | ICD-10-CM | POA: Diagnosis not present

## 2023-08-27 DIAGNOSIS — E7849 Other hyperlipidemia: Secondary | ICD-10-CM | POA: Diagnosis not present

## 2023-08-27 DIAGNOSIS — E1169 Type 2 diabetes mellitus with other specified complication: Secondary | ICD-10-CM | POA: Diagnosis not present

## 2023-08-27 DIAGNOSIS — I10 Essential (primary) hypertension: Secondary | ICD-10-CM | POA: Diagnosis not present

## 2023-09-23 DIAGNOSIS — E1169 Type 2 diabetes mellitus with other specified complication: Secondary | ICD-10-CM | POA: Diagnosis not present

## 2023-09-23 DIAGNOSIS — I10 Essential (primary) hypertension: Secondary | ICD-10-CM | POA: Diagnosis not present

## 2023-09-23 DIAGNOSIS — E7849 Other hyperlipidemia: Secondary | ICD-10-CM | POA: Diagnosis not present

## 2023-10-08 DIAGNOSIS — E1129 Type 2 diabetes mellitus with other diabetic kidney complication: Secondary | ICD-10-CM | POA: Diagnosis not present

## 2023-10-25 DIAGNOSIS — E7849 Other hyperlipidemia: Secondary | ICD-10-CM | POA: Diagnosis not present

## 2023-10-25 DIAGNOSIS — I10 Essential (primary) hypertension: Secondary | ICD-10-CM | POA: Diagnosis not present

## 2023-10-25 DIAGNOSIS — E1169 Type 2 diabetes mellitus with other specified complication: Secondary | ICD-10-CM | POA: Diagnosis not present

## 2023-11-06 DIAGNOSIS — L602 Onychogryphosis: Secondary | ICD-10-CM | POA: Diagnosis not present

## 2023-11-06 DIAGNOSIS — Z7984 Long term (current) use of oral hypoglycemic drugs: Secondary | ICD-10-CM | POA: Diagnosis not present

## 2023-11-06 DIAGNOSIS — L603 Nail dystrophy: Secondary | ICD-10-CM | POA: Diagnosis not present

## 2023-11-06 DIAGNOSIS — E1151 Type 2 diabetes mellitus with diabetic peripheral angiopathy without gangrene: Secondary | ICD-10-CM | POA: Diagnosis not present

## 2023-11-26 DIAGNOSIS — E7849 Other hyperlipidemia: Secondary | ICD-10-CM | POA: Diagnosis not present

## 2023-11-26 DIAGNOSIS — I10 Essential (primary) hypertension: Secondary | ICD-10-CM | POA: Diagnosis not present

## 2023-11-26 DIAGNOSIS — E1169 Type 2 diabetes mellitus with other specified complication: Secondary | ICD-10-CM | POA: Diagnosis not present

## 2023-12-25 DIAGNOSIS — E7849 Other hyperlipidemia: Secondary | ICD-10-CM | POA: Diagnosis not present

## 2023-12-25 DIAGNOSIS — I10 Essential (primary) hypertension: Secondary | ICD-10-CM | POA: Diagnosis not present

## 2023-12-25 DIAGNOSIS — E1169 Type 2 diabetes mellitus with other specified complication: Secondary | ICD-10-CM | POA: Diagnosis not present

## 2024-01-12 DIAGNOSIS — L602 Onychogryphosis: Secondary | ICD-10-CM | POA: Diagnosis not present

## 2024-01-12 DIAGNOSIS — Z7984 Long term (current) use of oral hypoglycemic drugs: Secondary | ICD-10-CM | POA: Diagnosis not present

## 2024-01-12 DIAGNOSIS — E1151 Type 2 diabetes mellitus with diabetic peripheral angiopathy without gangrene: Secondary | ICD-10-CM | POA: Diagnosis not present

## 2024-01-12 DIAGNOSIS — L603 Nail dystrophy: Secondary | ICD-10-CM | POA: Diagnosis not present

## 2024-01-22 DIAGNOSIS — M62522 Muscle wasting and atrophy, not elsewhere classified, left upper arm: Secondary | ICD-10-CM | POA: Diagnosis not present

## 2024-01-22 DIAGNOSIS — M62521 Muscle wasting and atrophy, not elsewhere classified, right upper arm: Secondary | ICD-10-CM | POA: Diagnosis not present

## 2024-01-22 DIAGNOSIS — M62562 Muscle wasting and atrophy, not elsewhere classified, left lower leg: Secondary | ICD-10-CM | POA: Diagnosis not present

## 2024-01-22 DIAGNOSIS — R41841 Cognitive communication deficit: Secondary | ICD-10-CM | POA: Diagnosis not present

## 2024-01-22 DIAGNOSIS — M62561 Muscle wasting and atrophy, not elsewhere classified, right lower leg: Secondary | ICD-10-CM | POA: Diagnosis not present

## 2024-01-22 DIAGNOSIS — R259 Unspecified abnormal involuntary movements: Secondary | ICD-10-CM | POA: Diagnosis not present

## 2024-01-23 DIAGNOSIS — M62561 Muscle wasting and atrophy, not elsewhere classified, right lower leg: Secondary | ICD-10-CM | POA: Diagnosis not present

## 2024-01-23 DIAGNOSIS — M62562 Muscle wasting and atrophy, not elsewhere classified, left lower leg: Secondary | ICD-10-CM | POA: Diagnosis not present

## 2024-01-23 DIAGNOSIS — R41841 Cognitive communication deficit: Secondary | ICD-10-CM | POA: Diagnosis not present

## 2024-01-23 DIAGNOSIS — M62521 Muscle wasting and atrophy, not elsewhere classified, right upper arm: Secondary | ICD-10-CM | POA: Diagnosis not present

## 2024-01-23 DIAGNOSIS — R259 Unspecified abnormal involuntary movements: Secondary | ICD-10-CM | POA: Diagnosis not present

## 2024-01-23 DIAGNOSIS — M62522 Muscle wasting and atrophy, not elsewhere classified, left upper arm: Secondary | ICD-10-CM | POA: Diagnosis not present

## 2024-01-24 DIAGNOSIS — R41841 Cognitive communication deficit: Secondary | ICD-10-CM | POA: Diagnosis not present

## 2024-01-24 DIAGNOSIS — M62562 Muscle wasting and atrophy, not elsewhere classified, left lower leg: Secondary | ICD-10-CM | POA: Diagnosis not present

## 2024-01-24 DIAGNOSIS — R259 Unspecified abnormal involuntary movements: Secondary | ICD-10-CM | POA: Diagnosis not present

## 2024-01-24 DIAGNOSIS — M62522 Muscle wasting and atrophy, not elsewhere classified, left upper arm: Secondary | ICD-10-CM | POA: Diagnosis not present

## 2024-01-24 DIAGNOSIS — M62521 Muscle wasting and atrophy, not elsewhere classified, right upper arm: Secondary | ICD-10-CM | POA: Diagnosis not present

## 2024-01-25 DIAGNOSIS — I10 Essential (primary) hypertension: Secondary | ICD-10-CM | POA: Diagnosis not present

## 2024-01-25 DIAGNOSIS — E7849 Other hyperlipidemia: Secondary | ICD-10-CM | POA: Diagnosis not present

## 2024-01-25 DIAGNOSIS — M62522 Muscle wasting and atrophy, not elsewhere classified, left upper arm: Secondary | ICD-10-CM | POA: Diagnosis not present

## 2024-01-25 DIAGNOSIS — R41841 Cognitive communication deficit: Secondary | ICD-10-CM | POA: Diagnosis not present

## 2024-01-25 DIAGNOSIS — R259 Unspecified abnormal involuntary movements: Secondary | ICD-10-CM | POA: Diagnosis not present

## 2024-01-25 DIAGNOSIS — E1169 Type 2 diabetes mellitus with other specified complication: Secondary | ICD-10-CM | POA: Diagnosis not present

## 2024-01-25 DIAGNOSIS — M62561 Muscle wasting and atrophy, not elsewhere classified, right lower leg: Secondary | ICD-10-CM | POA: Diagnosis not present

## 2024-01-25 DIAGNOSIS — M62562 Muscle wasting and atrophy, not elsewhere classified, left lower leg: Secondary | ICD-10-CM | POA: Diagnosis not present

## 2024-01-25 DIAGNOSIS — M62521 Muscle wasting and atrophy, not elsewhere classified, right upper arm: Secondary | ICD-10-CM | POA: Diagnosis not present

## 2024-01-26 DIAGNOSIS — M62522 Muscle wasting and atrophy, not elsewhere classified, left upper arm: Secondary | ICD-10-CM | POA: Diagnosis not present

## 2024-01-26 DIAGNOSIS — M62562 Muscle wasting and atrophy, not elsewhere classified, left lower leg: Secondary | ICD-10-CM | POA: Diagnosis not present

## 2024-01-26 DIAGNOSIS — R41841 Cognitive communication deficit: Secondary | ICD-10-CM | POA: Diagnosis not present

## 2024-01-26 DIAGNOSIS — M62561 Muscle wasting and atrophy, not elsewhere classified, right lower leg: Secondary | ICD-10-CM | POA: Diagnosis not present

## 2024-01-26 DIAGNOSIS — M62521 Muscle wasting and atrophy, not elsewhere classified, right upper arm: Secondary | ICD-10-CM | POA: Diagnosis not present

## 2024-01-26 DIAGNOSIS — R259 Unspecified abnormal involuntary movements: Secondary | ICD-10-CM | POA: Diagnosis not present

## 2024-01-29 DIAGNOSIS — R259 Unspecified abnormal involuntary movements: Secondary | ICD-10-CM | POA: Diagnosis not present

## 2024-01-29 DIAGNOSIS — M62562 Muscle wasting and atrophy, not elsewhere classified, left lower leg: Secondary | ICD-10-CM | POA: Diagnosis not present

## 2024-01-29 DIAGNOSIS — M62522 Muscle wasting and atrophy, not elsewhere classified, left upper arm: Secondary | ICD-10-CM | POA: Diagnosis not present

## 2024-01-29 DIAGNOSIS — R41841 Cognitive communication deficit: Secondary | ICD-10-CM | POA: Diagnosis not present

## 2024-01-29 DIAGNOSIS — M62521 Muscle wasting and atrophy, not elsewhere classified, right upper arm: Secondary | ICD-10-CM | POA: Diagnosis not present

## 2024-01-29 DIAGNOSIS — M62561 Muscle wasting and atrophy, not elsewhere classified, right lower leg: Secondary | ICD-10-CM | POA: Diagnosis not present

## 2024-01-30 DIAGNOSIS — R259 Unspecified abnormal involuntary movements: Secondary | ICD-10-CM | POA: Diagnosis not present

## 2024-01-30 DIAGNOSIS — M62522 Muscle wasting and atrophy, not elsewhere classified, left upper arm: Secondary | ICD-10-CM | POA: Diagnosis not present

## 2024-01-30 DIAGNOSIS — M62561 Muscle wasting and atrophy, not elsewhere classified, right lower leg: Secondary | ICD-10-CM | POA: Diagnosis not present

## 2024-01-30 DIAGNOSIS — R41841 Cognitive communication deficit: Secondary | ICD-10-CM | POA: Diagnosis not present

## 2024-01-30 DIAGNOSIS — M62521 Muscle wasting and atrophy, not elsewhere classified, right upper arm: Secondary | ICD-10-CM | POA: Diagnosis not present

## 2024-01-30 DIAGNOSIS — M62562 Muscle wasting and atrophy, not elsewhere classified, left lower leg: Secondary | ICD-10-CM | POA: Diagnosis not present

## 2024-01-31 DIAGNOSIS — M62561 Muscle wasting and atrophy, not elsewhere classified, right lower leg: Secondary | ICD-10-CM | POA: Diagnosis not present

## 2024-01-31 DIAGNOSIS — M62522 Muscle wasting and atrophy, not elsewhere classified, left upper arm: Secondary | ICD-10-CM | POA: Diagnosis not present

## 2024-01-31 DIAGNOSIS — M62562 Muscle wasting and atrophy, not elsewhere classified, left lower leg: Secondary | ICD-10-CM | POA: Diagnosis not present

## 2024-01-31 DIAGNOSIS — R259 Unspecified abnormal involuntary movements: Secondary | ICD-10-CM | POA: Diagnosis not present

## 2024-01-31 DIAGNOSIS — M62521 Muscle wasting and atrophy, not elsewhere classified, right upper arm: Secondary | ICD-10-CM | POA: Diagnosis not present

## 2024-01-31 DIAGNOSIS — R41841 Cognitive communication deficit: Secondary | ICD-10-CM | POA: Diagnosis not present

## 2024-02-01 DIAGNOSIS — M62561 Muscle wasting and atrophy, not elsewhere classified, right lower leg: Secondary | ICD-10-CM | POA: Diagnosis not present

## 2024-02-01 DIAGNOSIS — M62522 Muscle wasting and atrophy, not elsewhere classified, left upper arm: Secondary | ICD-10-CM | POA: Diagnosis not present

## 2024-02-01 DIAGNOSIS — R2681 Unsteadiness on feet: Secondary | ICD-10-CM | POA: Diagnosis not present

## 2024-02-01 DIAGNOSIS — R1310 Dysphagia, unspecified: Secondary | ICD-10-CM | POA: Diagnosis not present

## 2024-02-01 DIAGNOSIS — R2689 Other abnormalities of gait and mobility: Secondary | ICD-10-CM | POA: Diagnosis not present

## 2024-02-01 DIAGNOSIS — M62562 Muscle wasting and atrophy, not elsewhere classified, left lower leg: Secondary | ICD-10-CM | POA: Diagnosis not present

## 2024-02-01 DIAGNOSIS — M62521 Muscle wasting and atrophy, not elsewhere classified, right upper arm: Secondary | ICD-10-CM | POA: Diagnosis not present

## 2024-02-02 DIAGNOSIS — M62562 Muscle wasting and atrophy, not elsewhere classified, left lower leg: Secondary | ICD-10-CM | POA: Diagnosis not present

## 2024-02-02 DIAGNOSIS — R1310 Dysphagia, unspecified: Secondary | ICD-10-CM | POA: Diagnosis not present

## 2024-02-02 DIAGNOSIS — M62561 Muscle wasting and atrophy, not elsewhere classified, right lower leg: Secondary | ICD-10-CM | POA: Diagnosis not present

## 2024-02-02 DIAGNOSIS — R2681 Unsteadiness on feet: Secondary | ICD-10-CM | POA: Diagnosis not present

## 2024-02-02 DIAGNOSIS — M62522 Muscle wasting and atrophy, not elsewhere classified, left upper arm: Secondary | ICD-10-CM | POA: Diagnosis not present

## 2024-02-02 DIAGNOSIS — R2689 Other abnormalities of gait and mobility: Secondary | ICD-10-CM | POA: Diagnosis not present

## 2024-02-02 DIAGNOSIS — M62521 Muscle wasting and atrophy, not elsewhere classified, right upper arm: Secondary | ICD-10-CM | POA: Diagnosis not present

## 2024-02-05 DIAGNOSIS — R2689 Other abnormalities of gait and mobility: Secondary | ICD-10-CM | POA: Diagnosis not present

## 2024-02-05 DIAGNOSIS — R1310 Dysphagia, unspecified: Secondary | ICD-10-CM | POA: Diagnosis not present

## 2024-02-05 DIAGNOSIS — M62521 Muscle wasting and atrophy, not elsewhere classified, right upper arm: Secondary | ICD-10-CM | POA: Diagnosis not present

## 2024-02-05 DIAGNOSIS — M62562 Muscle wasting and atrophy, not elsewhere classified, left lower leg: Secondary | ICD-10-CM | POA: Diagnosis not present

## 2024-02-05 DIAGNOSIS — M62561 Muscle wasting and atrophy, not elsewhere classified, right lower leg: Secondary | ICD-10-CM | POA: Diagnosis not present

## 2024-02-05 DIAGNOSIS — R2681 Unsteadiness on feet: Secondary | ICD-10-CM | POA: Diagnosis not present

## 2024-02-05 DIAGNOSIS — M62522 Muscle wasting and atrophy, not elsewhere classified, left upper arm: Secondary | ICD-10-CM | POA: Diagnosis not present

## 2024-02-06 DIAGNOSIS — M62521 Muscle wasting and atrophy, not elsewhere classified, right upper arm: Secondary | ICD-10-CM | POA: Diagnosis not present

## 2024-02-06 DIAGNOSIS — R2681 Unsteadiness on feet: Secondary | ICD-10-CM | POA: Diagnosis not present

## 2024-02-06 DIAGNOSIS — M62522 Muscle wasting and atrophy, not elsewhere classified, left upper arm: Secondary | ICD-10-CM | POA: Diagnosis not present

## 2024-02-06 DIAGNOSIS — M62562 Muscle wasting and atrophy, not elsewhere classified, left lower leg: Secondary | ICD-10-CM | POA: Diagnosis not present

## 2024-02-06 DIAGNOSIS — R1310 Dysphagia, unspecified: Secondary | ICD-10-CM | POA: Diagnosis not present

## 2024-02-06 DIAGNOSIS — M62561 Muscle wasting and atrophy, not elsewhere classified, right lower leg: Secondary | ICD-10-CM | POA: Diagnosis not present

## 2024-02-06 DIAGNOSIS — R2689 Other abnormalities of gait and mobility: Secondary | ICD-10-CM | POA: Diagnosis not present

## 2024-02-07 DIAGNOSIS — R1310 Dysphagia, unspecified: Secondary | ICD-10-CM | POA: Diagnosis not present

## 2024-02-07 DIAGNOSIS — M62561 Muscle wasting and atrophy, not elsewhere classified, right lower leg: Secondary | ICD-10-CM | POA: Diagnosis not present

## 2024-02-07 DIAGNOSIS — R2689 Other abnormalities of gait and mobility: Secondary | ICD-10-CM | POA: Diagnosis not present

## 2024-02-07 DIAGNOSIS — R2681 Unsteadiness on feet: Secondary | ICD-10-CM | POA: Diagnosis not present

## 2024-02-07 DIAGNOSIS — M62521 Muscle wasting and atrophy, not elsewhere classified, right upper arm: Secondary | ICD-10-CM | POA: Diagnosis not present

## 2024-02-07 DIAGNOSIS — M62562 Muscle wasting and atrophy, not elsewhere classified, left lower leg: Secondary | ICD-10-CM | POA: Diagnosis not present

## 2024-02-07 DIAGNOSIS — M62522 Muscle wasting and atrophy, not elsewhere classified, left upper arm: Secondary | ICD-10-CM | POA: Diagnosis not present

## 2024-02-08 DIAGNOSIS — M62521 Muscle wasting and atrophy, not elsewhere classified, right upper arm: Secondary | ICD-10-CM | POA: Diagnosis not present

## 2024-02-08 DIAGNOSIS — R2681 Unsteadiness on feet: Secondary | ICD-10-CM | POA: Diagnosis not present

## 2024-02-08 DIAGNOSIS — M62522 Muscle wasting and atrophy, not elsewhere classified, left upper arm: Secondary | ICD-10-CM | POA: Diagnosis not present

## 2024-02-08 DIAGNOSIS — M62562 Muscle wasting and atrophy, not elsewhere classified, left lower leg: Secondary | ICD-10-CM | POA: Diagnosis not present

## 2024-02-08 DIAGNOSIS — R2689 Other abnormalities of gait and mobility: Secondary | ICD-10-CM | POA: Diagnosis not present

## 2024-02-08 DIAGNOSIS — M62561 Muscle wasting and atrophy, not elsewhere classified, right lower leg: Secondary | ICD-10-CM | POA: Diagnosis not present

## 2024-02-08 DIAGNOSIS — R1310 Dysphagia, unspecified: Secondary | ICD-10-CM | POA: Diagnosis not present

## 2024-02-09 DIAGNOSIS — R2681 Unsteadiness on feet: Secondary | ICD-10-CM | POA: Diagnosis not present

## 2024-02-09 DIAGNOSIS — M62521 Muscle wasting and atrophy, not elsewhere classified, right upper arm: Secondary | ICD-10-CM | POA: Diagnosis not present

## 2024-02-09 DIAGNOSIS — M62561 Muscle wasting and atrophy, not elsewhere classified, right lower leg: Secondary | ICD-10-CM | POA: Diagnosis not present

## 2024-02-09 DIAGNOSIS — M62562 Muscle wasting and atrophy, not elsewhere classified, left lower leg: Secondary | ICD-10-CM | POA: Diagnosis not present

## 2024-02-09 DIAGNOSIS — R2689 Other abnormalities of gait and mobility: Secondary | ICD-10-CM | POA: Diagnosis not present

## 2024-02-09 DIAGNOSIS — R1310 Dysphagia, unspecified: Secondary | ICD-10-CM | POA: Diagnosis not present

## 2024-02-09 DIAGNOSIS — M62522 Muscle wasting and atrophy, not elsewhere classified, left upper arm: Secondary | ICD-10-CM | POA: Diagnosis not present

## 2024-02-12 DIAGNOSIS — R2689 Other abnormalities of gait and mobility: Secondary | ICD-10-CM | POA: Diagnosis not present

## 2024-02-12 DIAGNOSIS — R1310 Dysphagia, unspecified: Secondary | ICD-10-CM | POA: Diagnosis not present

## 2024-02-12 DIAGNOSIS — M62522 Muscle wasting and atrophy, not elsewhere classified, left upper arm: Secondary | ICD-10-CM | POA: Diagnosis not present

## 2024-02-12 DIAGNOSIS — M62561 Muscle wasting and atrophy, not elsewhere classified, right lower leg: Secondary | ICD-10-CM | POA: Diagnosis not present

## 2024-02-12 DIAGNOSIS — M62521 Muscle wasting and atrophy, not elsewhere classified, right upper arm: Secondary | ICD-10-CM | POA: Diagnosis not present

## 2024-02-12 DIAGNOSIS — R2681 Unsteadiness on feet: Secondary | ICD-10-CM | POA: Diagnosis not present

## 2024-02-12 DIAGNOSIS — M62562 Muscle wasting and atrophy, not elsewhere classified, left lower leg: Secondary | ICD-10-CM | POA: Diagnosis not present

## 2024-02-13 DIAGNOSIS — M62522 Muscle wasting and atrophy, not elsewhere classified, left upper arm: Secondary | ICD-10-CM | POA: Diagnosis not present

## 2024-02-13 DIAGNOSIS — R2681 Unsteadiness on feet: Secondary | ICD-10-CM | POA: Diagnosis not present

## 2024-02-13 DIAGNOSIS — M62562 Muscle wasting and atrophy, not elsewhere classified, left lower leg: Secondary | ICD-10-CM | POA: Diagnosis not present

## 2024-02-13 DIAGNOSIS — M62521 Muscle wasting and atrophy, not elsewhere classified, right upper arm: Secondary | ICD-10-CM | POA: Diagnosis not present

## 2024-02-13 DIAGNOSIS — R2689 Other abnormalities of gait and mobility: Secondary | ICD-10-CM | POA: Diagnosis not present

## 2024-02-13 DIAGNOSIS — R1310 Dysphagia, unspecified: Secondary | ICD-10-CM | POA: Diagnosis not present

## 2024-02-13 DIAGNOSIS — M62561 Muscle wasting and atrophy, not elsewhere classified, right lower leg: Secondary | ICD-10-CM | POA: Diagnosis not present

## 2024-02-14 DIAGNOSIS — M62522 Muscle wasting and atrophy, not elsewhere classified, left upper arm: Secondary | ICD-10-CM | POA: Diagnosis not present

## 2024-02-14 DIAGNOSIS — R2681 Unsteadiness on feet: Secondary | ICD-10-CM | POA: Diagnosis not present

## 2024-02-14 DIAGNOSIS — M62561 Muscle wasting and atrophy, not elsewhere classified, right lower leg: Secondary | ICD-10-CM | POA: Diagnosis not present

## 2024-02-14 DIAGNOSIS — M62562 Muscle wasting and atrophy, not elsewhere classified, left lower leg: Secondary | ICD-10-CM | POA: Diagnosis not present

## 2024-02-14 DIAGNOSIS — R1310 Dysphagia, unspecified: Secondary | ICD-10-CM | POA: Diagnosis not present

## 2024-02-14 DIAGNOSIS — R2689 Other abnormalities of gait and mobility: Secondary | ICD-10-CM | POA: Diagnosis not present

## 2024-02-14 DIAGNOSIS — M62521 Muscle wasting and atrophy, not elsewhere classified, right upper arm: Secondary | ICD-10-CM | POA: Diagnosis not present

## 2024-02-15 DIAGNOSIS — R1310 Dysphagia, unspecified: Secondary | ICD-10-CM | POA: Diagnosis not present

## 2024-02-15 DIAGNOSIS — M62522 Muscle wasting and atrophy, not elsewhere classified, left upper arm: Secondary | ICD-10-CM | POA: Diagnosis not present

## 2024-02-15 DIAGNOSIS — M62561 Muscle wasting and atrophy, not elsewhere classified, right lower leg: Secondary | ICD-10-CM | POA: Diagnosis not present

## 2024-02-15 DIAGNOSIS — M62562 Muscle wasting and atrophy, not elsewhere classified, left lower leg: Secondary | ICD-10-CM | POA: Diagnosis not present

## 2024-02-15 DIAGNOSIS — M62521 Muscle wasting and atrophy, not elsewhere classified, right upper arm: Secondary | ICD-10-CM | POA: Diagnosis not present

## 2024-02-15 DIAGNOSIS — R2689 Other abnormalities of gait and mobility: Secondary | ICD-10-CM | POA: Diagnosis not present

## 2024-02-15 DIAGNOSIS — R2681 Unsteadiness on feet: Secondary | ICD-10-CM | POA: Diagnosis not present

## 2024-02-16 DIAGNOSIS — R2689 Other abnormalities of gait and mobility: Secondary | ICD-10-CM | POA: Diagnosis not present

## 2024-02-16 DIAGNOSIS — R2681 Unsteadiness on feet: Secondary | ICD-10-CM | POA: Diagnosis not present

## 2024-02-16 DIAGNOSIS — M62561 Muscle wasting and atrophy, not elsewhere classified, right lower leg: Secondary | ICD-10-CM | POA: Diagnosis not present

## 2024-02-16 DIAGNOSIS — R1310 Dysphagia, unspecified: Secondary | ICD-10-CM | POA: Diagnosis not present

## 2024-02-16 DIAGNOSIS — M62522 Muscle wasting and atrophy, not elsewhere classified, left upper arm: Secondary | ICD-10-CM | POA: Diagnosis not present

## 2024-02-16 DIAGNOSIS — M62562 Muscle wasting and atrophy, not elsewhere classified, left lower leg: Secondary | ICD-10-CM | POA: Diagnosis not present

## 2024-02-16 DIAGNOSIS — M62521 Muscle wasting and atrophy, not elsewhere classified, right upper arm: Secondary | ICD-10-CM | POA: Diagnosis not present

## 2024-02-19 DIAGNOSIS — R2681 Unsteadiness on feet: Secondary | ICD-10-CM | POA: Diagnosis not present

## 2024-02-19 DIAGNOSIS — R2689 Other abnormalities of gait and mobility: Secondary | ICD-10-CM | POA: Diagnosis not present

## 2024-02-19 DIAGNOSIS — M62562 Muscle wasting and atrophy, not elsewhere classified, left lower leg: Secondary | ICD-10-CM | POA: Diagnosis not present

## 2024-02-19 DIAGNOSIS — M62561 Muscle wasting and atrophy, not elsewhere classified, right lower leg: Secondary | ICD-10-CM | POA: Diagnosis not present

## 2024-02-19 DIAGNOSIS — R1310 Dysphagia, unspecified: Secondary | ICD-10-CM | POA: Diagnosis not present

## 2024-02-19 DIAGNOSIS — M62521 Muscle wasting and atrophy, not elsewhere classified, right upper arm: Secondary | ICD-10-CM | POA: Diagnosis not present

## 2024-02-19 DIAGNOSIS — M62522 Muscle wasting and atrophy, not elsewhere classified, left upper arm: Secondary | ICD-10-CM | POA: Diagnosis not present

## 2024-02-20 DIAGNOSIS — R2681 Unsteadiness on feet: Secondary | ICD-10-CM | POA: Diagnosis not present

## 2024-02-20 DIAGNOSIS — R2689 Other abnormalities of gait and mobility: Secondary | ICD-10-CM | POA: Diagnosis not present

## 2024-02-20 DIAGNOSIS — M62562 Muscle wasting and atrophy, not elsewhere classified, left lower leg: Secondary | ICD-10-CM | POA: Diagnosis not present

## 2024-02-20 DIAGNOSIS — M62561 Muscle wasting and atrophy, not elsewhere classified, right lower leg: Secondary | ICD-10-CM | POA: Diagnosis not present

## 2024-02-20 DIAGNOSIS — R1310 Dysphagia, unspecified: Secondary | ICD-10-CM | POA: Diagnosis not present

## 2024-02-20 DIAGNOSIS — M62521 Muscle wasting and atrophy, not elsewhere classified, right upper arm: Secondary | ICD-10-CM | POA: Diagnosis not present

## 2024-02-20 DIAGNOSIS — M62522 Muscle wasting and atrophy, not elsewhere classified, left upper arm: Secondary | ICD-10-CM | POA: Diagnosis not present

## 2024-02-21 DIAGNOSIS — M62562 Muscle wasting and atrophy, not elsewhere classified, left lower leg: Secondary | ICD-10-CM | POA: Diagnosis not present

## 2024-02-21 DIAGNOSIS — M62522 Muscle wasting and atrophy, not elsewhere classified, left upper arm: Secondary | ICD-10-CM | POA: Diagnosis not present

## 2024-02-21 DIAGNOSIS — R2681 Unsteadiness on feet: Secondary | ICD-10-CM | POA: Diagnosis not present

## 2024-02-21 DIAGNOSIS — R1310 Dysphagia, unspecified: Secondary | ICD-10-CM | POA: Diagnosis not present

## 2024-02-21 DIAGNOSIS — R2689 Other abnormalities of gait and mobility: Secondary | ICD-10-CM | POA: Diagnosis not present

## 2024-02-21 DIAGNOSIS — M62561 Muscle wasting and atrophy, not elsewhere classified, right lower leg: Secondary | ICD-10-CM | POA: Diagnosis not present

## 2024-02-21 DIAGNOSIS — M62521 Muscle wasting and atrophy, not elsewhere classified, right upper arm: Secondary | ICD-10-CM | POA: Diagnosis not present

## 2024-02-22 DIAGNOSIS — E7849 Other hyperlipidemia: Secondary | ICD-10-CM | POA: Diagnosis not present

## 2024-02-22 DIAGNOSIS — E1169 Type 2 diabetes mellitus with other specified complication: Secondary | ICD-10-CM | POA: Diagnosis not present

## 2024-02-22 DIAGNOSIS — R2681 Unsteadiness on feet: Secondary | ICD-10-CM | POA: Diagnosis not present

## 2024-02-22 DIAGNOSIS — R2689 Other abnormalities of gait and mobility: Secondary | ICD-10-CM | POA: Diagnosis not present

## 2024-02-22 DIAGNOSIS — M62521 Muscle wasting and atrophy, not elsewhere classified, right upper arm: Secondary | ICD-10-CM | POA: Diagnosis not present

## 2024-02-22 DIAGNOSIS — M62522 Muscle wasting and atrophy, not elsewhere classified, left upper arm: Secondary | ICD-10-CM | POA: Diagnosis not present

## 2024-02-22 DIAGNOSIS — M62562 Muscle wasting and atrophy, not elsewhere classified, left lower leg: Secondary | ICD-10-CM | POA: Diagnosis not present

## 2024-02-22 DIAGNOSIS — I10 Essential (primary) hypertension: Secondary | ICD-10-CM | POA: Diagnosis not present

## 2024-02-22 DIAGNOSIS — R1310 Dysphagia, unspecified: Secondary | ICD-10-CM | POA: Diagnosis not present

## 2024-02-22 DIAGNOSIS — M62561 Muscle wasting and atrophy, not elsewhere classified, right lower leg: Secondary | ICD-10-CM | POA: Diagnosis not present

## 2024-02-23 DIAGNOSIS — M62521 Muscle wasting and atrophy, not elsewhere classified, right upper arm: Secondary | ICD-10-CM | POA: Diagnosis not present

## 2024-02-23 DIAGNOSIS — M62522 Muscle wasting and atrophy, not elsewhere classified, left upper arm: Secondary | ICD-10-CM | POA: Diagnosis not present

## 2024-02-23 DIAGNOSIS — R1310 Dysphagia, unspecified: Secondary | ICD-10-CM | POA: Diagnosis not present

## 2024-02-23 DIAGNOSIS — M62561 Muscle wasting and atrophy, not elsewhere classified, right lower leg: Secondary | ICD-10-CM | POA: Diagnosis not present

## 2024-02-23 DIAGNOSIS — M62562 Muscle wasting and atrophy, not elsewhere classified, left lower leg: Secondary | ICD-10-CM | POA: Diagnosis not present

## 2024-02-23 DIAGNOSIS — R2681 Unsteadiness on feet: Secondary | ICD-10-CM | POA: Diagnosis not present

## 2024-02-23 DIAGNOSIS — R2689 Other abnormalities of gait and mobility: Secondary | ICD-10-CM | POA: Diagnosis not present

## 2024-02-27 DIAGNOSIS — M62561 Muscle wasting and atrophy, not elsewhere classified, right lower leg: Secondary | ICD-10-CM | POA: Diagnosis not present

## 2024-02-27 DIAGNOSIS — R2681 Unsteadiness on feet: Secondary | ICD-10-CM | POA: Diagnosis not present

## 2024-02-27 DIAGNOSIS — R1310 Dysphagia, unspecified: Secondary | ICD-10-CM | POA: Diagnosis not present

## 2024-02-27 DIAGNOSIS — M62521 Muscle wasting and atrophy, not elsewhere classified, right upper arm: Secondary | ICD-10-CM | POA: Diagnosis not present

## 2024-02-27 DIAGNOSIS — M62522 Muscle wasting and atrophy, not elsewhere classified, left upper arm: Secondary | ICD-10-CM | POA: Diagnosis not present

## 2024-02-27 DIAGNOSIS — R2689 Other abnormalities of gait and mobility: Secondary | ICD-10-CM | POA: Diagnosis not present

## 2024-02-27 DIAGNOSIS — M62562 Muscle wasting and atrophy, not elsewhere classified, left lower leg: Secondary | ICD-10-CM | POA: Diagnosis not present

## 2024-02-28 DIAGNOSIS — M62561 Muscle wasting and atrophy, not elsewhere classified, right lower leg: Secondary | ICD-10-CM | POA: Diagnosis not present

## 2024-02-28 DIAGNOSIS — R2681 Unsteadiness on feet: Secondary | ICD-10-CM | POA: Diagnosis not present

## 2024-02-28 DIAGNOSIS — R2689 Other abnormalities of gait and mobility: Secondary | ICD-10-CM | POA: Diagnosis not present

## 2024-02-28 DIAGNOSIS — R1310 Dysphagia, unspecified: Secondary | ICD-10-CM | POA: Diagnosis not present

## 2024-02-28 DIAGNOSIS — M62521 Muscle wasting and atrophy, not elsewhere classified, right upper arm: Secondary | ICD-10-CM | POA: Diagnosis not present

## 2024-02-28 DIAGNOSIS — M62562 Muscle wasting and atrophy, not elsewhere classified, left lower leg: Secondary | ICD-10-CM | POA: Diagnosis not present

## 2024-02-28 DIAGNOSIS — M62522 Muscle wasting and atrophy, not elsewhere classified, left upper arm: Secondary | ICD-10-CM | POA: Diagnosis not present

## 2024-02-29 DIAGNOSIS — M62521 Muscle wasting and atrophy, not elsewhere classified, right upper arm: Secondary | ICD-10-CM | POA: Diagnosis not present

## 2024-02-29 DIAGNOSIS — M62562 Muscle wasting and atrophy, not elsewhere classified, left lower leg: Secondary | ICD-10-CM | POA: Diagnosis not present

## 2024-02-29 DIAGNOSIS — R1310 Dysphagia, unspecified: Secondary | ICD-10-CM | POA: Diagnosis not present

## 2024-02-29 DIAGNOSIS — R2681 Unsteadiness on feet: Secondary | ICD-10-CM | POA: Diagnosis not present

## 2024-02-29 DIAGNOSIS — M62561 Muscle wasting and atrophy, not elsewhere classified, right lower leg: Secondary | ICD-10-CM | POA: Diagnosis not present

## 2024-02-29 DIAGNOSIS — M62522 Muscle wasting and atrophy, not elsewhere classified, left upper arm: Secondary | ICD-10-CM | POA: Diagnosis not present

## 2024-02-29 DIAGNOSIS — R2689 Other abnormalities of gait and mobility: Secondary | ICD-10-CM | POA: Diagnosis not present

## 2024-03-01 DIAGNOSIS — M62522 Muscle wasting and atrophy, not elsewhere classified, left upper arm: Secondary | ICD-10-CM | POA: Diagnosis not present

## 2024-03-01 DIAGNOSIS — M62521 Muscle wasting and atrophy, not elsewhere classified, right upper arm: Secondary | ICD-10-CM | POA: Diagnosis not present

## 2024-03-01 DIAGNOSIS — M62561 Muscle wasting and atrophy, not elsewhere classified, right lower leg: Secondary | ICD-10-CM | POA: Diagnosis not present

## 2024-03-01 DIAGNOSIS — M62562 Muscle wasting and atrophy, not elsewhere classified, left lower leg: Secondary | ICD-10-CM | POA: Diagnosis not present

## 2024-03-01 DIAGNOSIS — R1310 Dysphagia, unspecified: Secondary | ICD-10-CM | POA: Diagnosis not present

## 2024-03-01 DIAGNOSIS — R2681 Unsteadiness on feet: Secondary | ICD-10-CM | POA: Diagnosis not present

## 2024-03-01 DIAGNOSIS — R2689 Other abnormalities of gait and mobility: Secondary | ICD-10-CM | POA: Diagnosis not present

## 2024-03-04 DIAGNOSIS — M62522 Muscle wasting and atrophy, not elsewhere classified, left upper arm: Secondary | ICD-10-CM | POA: Diagnosis not present

## 2024-03-04 DIAGNOSIS — R2689 Other abnormalities of gait and mobility: Secondary | ICD-10-CM | POA: Diagnosis not present

## 2024-03-04 DIAGNOSIS — M62561 Muscle wasting and atrophy, not elsewhere classified, right lower leg: Secondary | ICD-10-CM | POA: Diagnosis not present

## 2024-03-04 DIAGNOSIS — M62521 Muscle wasting and atrophy, not elsewhere classified, right upper arm: Secondary | ICD-10-CM | POA: Diagnosis not present

## 2024-03-04 DIAGNOSIS — M62562 Muscle wasting and atrophy, not elsewhere classified, left lower leg: Secondary | ICD-10-CM | POA: Diagnosis not present

## 2024-03-05 DIAGNOSIS — M62521 Muscle wasting and atrophy, not elsewhere classified, right upper arm: Secondary | ICD-10-CM | POA: Diagnosis not present

## 2024-03-05 DIAGNOSIS — M62562 Muscle wasting and atrophy, not elsewhere classified, left lower leg: Secondary | ICD-10-CM | POA: Diagnosis not present

## 2024-03-05 DIAGNOSIS — R2689 Other abnormalities of gait and mobility: Secondary | ICD-10-CM | POA: Diagnosis not present

## 2024-03-05 DIAGNOSIS — M62522 Muscle wasting and atrophy, not elsewhere classified, left upper arm: Secondary | ICD-10-CM | POA: Diagnosis not present

## 2024-03-05 DIAGNOSIS — M62561 Muscle wasting and atrophy, not elsewhere classified, right lower leg: Secondary | ICD-10-CM | POA: Diagnosis not present

## 2024-03-06 DIAGNOSIS — M62562 Muscle wasting and atrophy, not elsewhere classified, left lower leg: Secondary | ICD-10-CM | POA: Diagnosis not present

## 2024-03-06 DIAGNOSIS — M62561 Muscle wasting and atrophy, not elsewhere classified, right lower leg: Secondary | ICD-10-CM | POA: Diagnosis not present

## 2024-03-06 DIAGNOSIS — M62522 Muscle wasting and atrophy, not elsewhere classified, left upper arm: Secondary | ICD-10-CM | POA: Diagnosis not present

## 2024-03-06 DIAGNOSIS — R2689 Other abnormalities of gait and mobility: Secondary | ICD-10-CM | POA: Diagnosis not present

## 2024-03-06 DIAGNOSIS — M62521 Muscle wasting and atrophy, not elsewhere classified, right upper arm: Secondary | ICD-10-CM | POA: Diagnosis not present

## 2024-03-07 DIAGNOSIS — M62562 Muscle wasting and atrophy, not elsewhere classified, left lower leg: Secondary | ICD-10-CM | POA: Diagnosis not present

## 2024-03-07 DIAGNOSIS — M62521 Muscle wasting and atrophy, not elsewhere classified, right upper arm: Secondary | ICD-10-CM | POA: Diagnosis not present

## 2024-03-07 DIAGNOSIS — R2689 Other abnormalities of gait and mobility: Secondary | ICD-10-CM | POA: Diagnosis not present

## 2024-03-07 DIAGNOSIS — M62522 Muscle wasting and atrophy, not elsewhere classified, left upper arm: Secondary | ICD-10-CM | POA: Diagnosis not present

## 2024-03-07 DIAGNOSIS — M62561 Muscle wasting and atrophy, not elsewhere classified, right lower leg: Secondary | ICD-10-CM | POA: Diagnosis not present

## 2024-03-08 DIAGNOSIS — M62561 Muscle wasting and atrophy, not elsewhere classified, right lower leg: Secondary | ICD-10-CM | POA: Diagnosis not present

## 2024-03-08 DIAGNOSIS — R2689 Other abnormalities of gait and mobility: Secondary | ICD-10-CM | POA: Diagnosis not present

## 2024-03-08 DIAGNOSIS — M62562 Muscle wasting and atrophy, not elsewhere classified, left lower leg: Secondary | ICD-10-CM | POA: Diagnosis not present

## 2024-03-08 DIAGNOSIS — M62521 Muscle wasting and atrophy, not elsewhere classified, right upper arm: Secondary | ICD-10-CM | POA: Diagnosis not present

## 2024-03-08 DIAGNOSIS — M62522 Muscle wasting and atrophy, not elsewhere classified, left upper arm: Secondary | ICD-10-CM | POA: Diagnosis not present

## 2024-03-12 DIAGNOSIS — R2689 Other abnormalities of gait and mobility: Secondary | ICD-10-CM | POA: Diagnosis not present

## 2024-03-12 DIAGNOSIS — M62522 Muscle wasting and atrophy, not elsewhere classified, left upper arm: Secondary | ICD-10-CM | POA: Diagnosis not present

## 2024-03-12 DIAGNOSIS — M62521 Muscle wasting and atrophy, not elsewhere classified, right upper arm: Secondary | ICD-10-CM | POA: Diagnosis not present

## 2024-03-12 DIAGNOSIS — M62561 Muscle wasting and atrophy, not elsewhere classified, right lower leg: Secondary | ICD-10-CM | POA: Diagnosis not present

## 2024-03-12 DIAGNOSIS — M62562 Muscle wasting and atrophy, not elsewhere classified, left lower leg: Secondary | ICD-10-CM | POA: Diagnosis not present

## 2024-03-13 DIAGNOSIS — M62522 Muscle wasting and atrophy, not elsewhere classified, left upper arm: Secondary | ICD-10-CM | POA: Diagnosis not present

## 2024-03-13 DIAGNOSIS — M62521 Muscle wasting and atrophy, not elsewhere classified, right upper arm: Secondary | ICD-10-CM | POA: Diagnosis not present

## 2024-03-13 DIAGNOSIS — M62562 Muscle wasting and atrophy, not elsewhere classified, left lower leg: Secondary | ICD-10-CM | POA: Diagnosis not present

## 2024-03-13 DIAGNOSIS — M62561 Muscle wasting and atrophy, not elsewhere classified, right lower leg: Secondary | ICD-10-CM | POA: Diagnosis not present

## 2024-03-13 DIAGNOSIS — R2689 Other abnormalities of gait and mobility: Secondary | ICD-10-CM | POA: Diagnosis not present

## 2024-03-14 DIAGNOSIS — M62521 Muscle wasting and atrophy, not elsewhere classified, right upper arm: Secondary | ICD-10-CM | POA: Diagnosis not present

## 2024-03-14 DIAGNOSIS — M62562 Muscle wasting and atrophy, not elsewhere classified, left lower leg: Secondary | ICD-10-CM | POA: Diagnosis not present

## 2024-03-14 DIAGNOSIS — M62561 Muscle wasting and atrophy, not elsewhere classified, right lower leg: Secondary | ICD-10-CM | POA: Diagnosis not present

## 2024-03-14 DIAGNOSIS — M62522 Muscle wasting and atrophy, not elsewhere classified, left upper arm: Secondary | ICD-10-CM | POA: Diagnosis not present

## 2024-03-14 DIAGNOSIS — R2689 Other abnormalities of gait and mobility: Secondary | ICD-10-CM | POA: Diagnosis not present

## 2024-03-15 DIAGNOSIS — M62562 Muscle wasting and atrophy, not elsewhere classified, left lower leg: Secondary | ICD-10-CM | POA: Diagnosis not present

## 2024-03-15 DIAGNOSIS — M62522 Muscle wasting and atrophy, not elsewhere classified, left upper arm: Secondary | ICD-10-CM | POA: Diagnosis not present

## 2024-03-15 DIAGNOSIS — R2689 Other abnormalities of gait and mobility: Secondary | ICD-10-CM | POA: Diagnosis not present

## 2024-03-15 DIAGNOSIS — M62521 Muscle wasting and atrophy, not elsewhere classified, right upper arm: Secondary | ICD-10-CM | POA: Diagnosis not present

## 2024-03-15 DIAGNOSIS — M62561 Muscle wasting and atrophy, not elsewhere classified, right lower leg: Secondary | ICD-10-CM | POA: Diagnosis not present

## 2024-03-16 DIAGNOSIS — M62561 Muscle wasting and atrophy, not elsewhere classified, right lower leg: Secondary | ICD-10-CM | POA: Diagnosis not present

## 2024-03-16 DIAGNOSIS — M62521 Muscle wasting and atrophy, not elsewhere classified, right upper arm: Secondary | ICD-10-CM | POA: Diagnosis not present

## 2024-03-16 DIAGNOSIS — M62562 Muscle wasting and atrophy, not elsewhere classified, left lower leg: Secondary | ICD-10-CM | POA: Diagnosis not present

## 2024-03-16 DIAGNOSIS — R2689 Other abnormalities of gait and mobility: Secondary | ICD-10-CM | POA: Diagnosis not present

## 2024-03-16 DIAGNOSIS — M62522 Muscle wasting and atrophy, not elsewhere classified, left upper arm: Secondary | ICD-10-CM | POA: Diagnosis not present

## 2024-03-19 DIAGNOSIS — M62562 Muscle wasting and atrophy, not elsewhere classified, left lower leg: Secondary | ICD-10-CM | POA: Diagnosis not present

## 2024-03-19 DIAGNOSIS — R2689 Other abnormalities of gait and mobility: Secondary | ICD-10-CM | POA: Diagnosis not present

## 2024-03-19 DIAGNOSIS — M62561 Muscle wasting and atrophy, not elsewhere classified, right lower leg: Secondary | ICD-10-CM | POA: Diagnosis not present

## 2024-03-19 DIAGNOSIS — M62521 Muscle wasting and atrophy, not elsewhere classified, right upper arm: Secondary | ICD-10-CM | POA: Diagnosis not present

## 2024-03-19 DIAGNOSIS — M62522 Muscle wasting and atrophy, not elsewhere classified, left upper arm: Secondary | ICD-10-CM | POA: Diagnosis not present

## 2024-03-20 DIAGNOSIS — R2689 Other abnormalities of gait and mobility: Secondary | ICD-10-CM | POA: Diagnosis not present

## 2024-03-20 DIAGNOSIS — M62562 Muscle wasting and atrophy, not elsewhere classified, left lower leg: Secondary | ICD-10-CM | POA: Diagnosis not present

## 2024-03-20 DIAGNOSIS — M62561 Muscle wasting and atrophy, not elsewhere classified, right lower leg: Secondary | ICD-10-CM | POA: Diagnosis not present

## 2024-03-20 DIAGNOSIS — M62521 Muscle wasting and atrophy, not elsewhere classified, right upper arm: Secondary | ICD-10-CM | POA: Diagnosis not present

## 2024-03-20 DIAGNOSIS — M62522 Muscle wasting and atrophy, not elsewhere classified, left upper arm: Secondary | ICD-10-CM | POA: Diagnosis not present

## 2024-03-21 DIAGNOSIS — R2689 Other abnormalities of gait and mobility: Secondary | ICD-10-CM | POA: Diagnosis not present

## 2024-03-21 DIAGNOSIS — M62562 Muscle wasting and atrophy, not elsewhere classified, left lower leg: Secondary | ICD-10-CM | POA: Diagnosis not present

## 2024-03-21 DIAGNOSIS — M62561 Muscle wasting and atrophy, not elsewhere classified, right lower leg: Secondary | ICD-10-CM | POA: Diagnosis not present

## 2024-03-21 DIAGNOSIS — M62521 Muscle wasting and atrophy, not elsewhere classified, right upper arm: Secondary | ICD-10-CM | POA: Diagnosis not present

## 2024-03-21 DIAGNOSIS — M62522 Muscle wasting and atrophy, not elsewhere classified, left upper arm: Secondary | ICD-10-CM | POA: Diagnosis not present

## 2024-03-22 DIAGNOSIS — R2689 Other abnormalities of gait and mobility: Secondary | ICD-10-CM | POA: Diagnosis not present

## 2024-03-22 DIAGNOSIS — M62521 Muscle wasting and atrophy, not elsewhere classified, right upper arm: Secondary | ICD-10-CM | POA: Diagnosis not present

## 2024-03-22 DIAGNOSIS — M62562 Muscle wasting and atrophy, not elsewhere classified, left lower leg: Secondary | ICD-10-CM | POA: Diagnosis not present

## 2024-03-22 DIAGNOSIS — M62522 Muscle wasting and atrophy, not elsewhere classified, left upper arm: Secondary | ICD-10-CM | POA: Diagnosis not present

## 2024-03-22 DIAGNOSIS — M62561 Muscle wasting and atrophy, not elsewhere classified, right lower leg: Secondary | ICD-10-CM | POA: Diagnosis not present

## 2024-03-23 DIAGNOSIS — I10 Essential (primary) hypertension: Secondary | ICD-10-CM | POA: Diagnosis not present

## 2024-03-23 DIAGNOSIS — E7849 Other hyperlipidemia: Secondary | ICD-10-CM | POA: Diagnosis not present

## 2024-03-23 DIAGNOSIS — E1169 Type 2 diabetes mellitus with other specified complication: Secondary | ICD-10-CM | POA: Diagnosis not present

## 2024-03-25 DIAGNOSIS — M62522 Muscle wasting and atrophy, not elsewhere classified, left upper arm: Secondary | ICD-10-CM | POA: Diagnosis not present

## 2024-03-25 DIAGNOSIS — M62561 Muscle wasting and atrophy, not elsewhere classified, right lower leg: Secondary | ICD-10-CM | POA: Diagnosis not present

## 2024-03-25 DIAGNOSIS — M62562 Muscle wasting and atrophy, not elsewhere classified, left lower leg: Secondary | ICD-10-CM | POA: Diagnosis not present

## 2024-03-25 DIAGNOSIS — R2689 Other abnormalities of gait and mobility: Secondary | ICD-10-CM | POA: Diagnosis not present

## 2024-03-25 DIAGNOSIS — M62521 Muscle wasting and atrophy, not elsewhere classified, right upper arm: Secondary | ICD-10-CM | POA: Diagnosis not present

## 2024-03-26 DIAGNOSIS — R2689 Other abnormalities of gait and mobility: Secondary | ICD-10-CM | POA: Diagnosis not present

## 2024-03-26 DIAGNOSIS — M62561 Muscle wasting and atrophy, not elsewhere classified, right lower leg: Secondary | ICD-10-CM | POA: Diagnosis not present

## 2024-03-26 DIAGNOSIS — M62562 Muscle wasting and atrophy, not elsewhere classified, left lower leg: Secondary | ICD-10-CM | POA: Diagnosis not present

## 2024-03-26 DIAGNOSIS — M62522 Muscle wasting and atrophy, not elsewhere classified, left upper arm: Secondary | ICD-10-CM | POA: Diagnosis not present

## 2024-03-26 DIAGNOSIS — M62521 Muscle wasting and atrophy, not elsewhere classified, right upper arm: Secondary | ICD-10-CM | POA: Diagnosis not present

## 2024-03-27 DIAGNOSIS — M62522 Muscle wasting and atrophy, not elsewhere classified, left upper arm: Secondary | ICD-10-CM | POA: Diagnosis not present

## 2024-03-27 DIAGNOSIS — M62562 Muscle wasting and atrophy, not elsewhere classified, left lower leg: Secondary | ICD-10-CM | POA: Diagnosis not present

## 2024-03-27 DIAGNOSIS — M62521 Muscle wasting and atrophy, not elsewhere classified, right upper arm: Secondary | ICD-10-CM | POA: Diagnosis not present

## 2024-03-27 DIAGNOSIS — R2689 Other abnormalities of gait and mobility: Secondary | ICD-10-CM | POA: Diagnosis not present

## 2024-03-27 DIAGNOSIS — M62561 Muscle wasting and atrophy, not elsewhere classified, right lower leg: Secondary | ICD-10-CM | POA: Diagnosis not present

## 2024-03-28 DIAGNOSIS — M62522 Muscle wasting and atrophy, not elsewhere classified, left upper arm: Secondary | ICD-10-CM | POA: Diagnosis not present

## 2024-03-28 DIAGNOSIS — R2689 Other abnormalities of gait and mobility: Secondary | ICD-10-CM | POA: Diagnosis not present

## 2024-03-28 DIAGNOSIS — M62561 Muscle wasting and atrophy, not elsewhere classified, right lower leg: Secondary | ICD-10-CM | POA: Diagnosis not present

## 2024-03-28 DIAGNOSIS — M62521 Muscle wasting and atrophy, not elsewhere classified, right upper arm: Secondary | ICD-10-CM | POA: Diagnosis not present

## 2024-03-28 DIAGNOSIS — M62562 Muscle wasting and atrophy, not elsewhere classified, left lower leg: Secondary | ICD-10-CM | POA: Diagnosis not present

## 2024-03-30 DIAGNOSIS — R2689 Other abnormalities of gait and mobility: Secondary | ICD-10-CM | POA: Diagnosis not present

## 2024-03-30 DIAGNOSIS — M62521 Muscle wasting and atrophy, not elsewhere classified, right upper arm: Secondary | ICD-10-CM | POA: Diagnosis not present

## 2024-03-30 DIAGNOSIS — M62522 Muscle wasting and atrophy, not elsewhere classified, left upper arm: Secondary | ICD-10-CM | POA: Diagnosis not present

## 2024-03-30 DIAGNOSIS — M62562 Muscle wasting and atrophy, not elsewhere classified, left lower leg: Secondary | ICD-10-CM | POA: Diagnosis not present

## 2024-03-30 DIAGNOSIS — M62561 Muscle wasting and atrophy, not elsewhere classified, right lower leg: Secondary | ICD-10-CM | POA: Diagnosis not present

## 2024-04-02 DIAGNOSIS — M6281 Muscle weakness (generalized): Secondary | ICD-10-CM | POA: Diagnosis not present

## 2024-04-02 DIAGNOSIS — Z741 Need for assistance with personal care: Secondary | ICD-10-CM | POA: Diagnosis not present

## 2024-04-02 DIAGNOSIS — M62562 Muscle wasting and atrophy, not elsewhere classified, left lower leg: Secondary | ICD-10-CM | POA: Diagnosis not present

## 2024-04-02 DIAGNOSIS — R2689 Other abnormalities of gait and mobility: Secondary | ICD-10-CM | POA: Diagnosis not present

## 2024-04-02 DIAGNOSIS — M62561 Muscle wasting and atrophy, not elsewhere classified, right lower leg: Secondary | ICD-10-CM | POA: Diagnosis not present

## 2024-04-04 DIAGNOSIS — M6281 Muscle weakness (generalized): Secondary | ICD-10-CM | POA: Diagnosis not present

## 2024-04-04 DIAGNOSIS — M62562 Muscle wasting and atrophy, not elsewhere classified, left lower leg: Secondary | ICD-10-CM | POA: Diagnosis not present

## 2024-04-04 DIAGNOSIS — Z741 Need for assistance with personal care: Secondary | ICD-10-CM | POA: Diagnosis not present

## 2024-04-04 DIAGNOSIS — R2689 Other abnormalities of gait and mobility: Secondary | ICD-10-CM | POA: Diagnosis not present

## 2024-04-04 DIAGNOSIS — M62561 Muscle wasting and atrophy, not elsewhere classified, right lower leg: Secondary | ICD-10-CM | POA: Diagnosis not present

## 2024-04-05 DIAGNOSIS — M62562 Muscle wasting and atrophy, not elsewhere classified, left lower leg: Secondary | ICD-10-CM | POA: Diagnosis not present

## 2024-04-05 DIAGNOSIS — Z741 Need for assistance with personal care: Secondary | ICD-10-CM | POA: Diagnosis not present

## 2024-04-05 DIAGNOSIS — M62561 Muscle wasting and atrophy, not elsewhere classified, right lower leg: Secondary | ICD-10-CM | POA: Diagnosis not present

## 2024-04-05 DIAGNOSIS — R2689 Other abnormalities of gait and mobility: Secondary | ICD-10-CM | POA: Diagnosis not present

## 2024-04-05 DIAGNOSIS — M6281 Muscle weakness (generalized): Secondary | ICD-10-CM | POA: Diagnosis not present

## 2024-04-06 DIAGNOSIS — R2689 Other abnormalities of gait and mobility: Secondary | ICD-10-CM | POA: Diagnosis not present

## 2024-04-06 DIAGNOSIS — M62561 Muscle wasting and atrophy, not elsewhere classified, right lower leg: Secondary | ICD-10-CM | POA: Diagnosis not present

## 2024-04-06 DIAGNOSIS — M6281 Muscle weakness (generalized): Secondary | ICD-10-CM | POA: Diagnosis not present

## 2024-04-06 DIAGNOSIS — Z741 Need for assistance with personal care: Secondary | ICD-10-CM | POA: Diagnosis not present

## 2024-04-06 DIAGNOSIS — M62562 Muscle wasting and atrophy, not elsewhere classified, left lower leg: Secondary | ICD-10-CM | POA: Diagnosis not present

## 2024-04-08 DIAGNOSIS — M62561 Muscle wasting and atrophy, not elsewhere classified, right lower leg: Secondary | ICD-10-CM | POA: Diagnosis not present

## 2024-04-08 DIAGNOSIS — Z741 Need for assistance with personal care: Secondary | ICD-10-CM | POA: Diagnosis not present

## 2024-04-08 DIAGNOSIS — R2689 Other abnormalities of gait and mobility: Secondary | ICD-10-CM | POA: Diagnosis not present

## 2024-04-08 DIAGNOSIS — M62562 Muscle wasting and atrophy, not elsewhere classified, left lower leg: Secondary | ICD-10-CM | POA: Diagnosis not present

## 2024-04-08 DIAGNOSIS — M6281 Muscle weakness (generalized): Secondary | ICD-10-CM | POA: Diagnosis not present

## 2024-04-09 DIAGNOSIS — M62561 Muscle wasting and atrophy, not elsewhere classified, right lower leg: Secondary | ICD-10-CM | POA: Diagnosis not present

## 2024-04-09 DIAGNOSIS — Z741 Need for assistance with personal care: Secondary | ICD-10-CM | POA: Diagnosis not present

## 2024-04-09 DIAGNOSIS — M62562 Muscle wasting and atrophy, not elsewhere classified, left lower leg: Secondary | ICD-10-CM | POA: Diagnosis not present

## 2024-04-09 DIAGNOSIS — M6281 Muscle weakness (generalized): Secondary | ICD-10-CM | POA: Diagnosis not present

## 2024-04-09 DIAGNOSIS — R2689 Other abnormalities of gait and mobility: Secondary | ICD-10-CM | POA: Diagnosis not present

## 2024-04-10 DIAGNOSIS — M62561 Muscle wasting and atrophy, not elsewhere classified, right lower leg: Secondary | ICD-10-CM | POA: Diagnosis not present

## 2024-04-10 DIAGNOSIS — M6281 Muscle weakness (generalized): Secondary | ICD-10-CM | POA: Diagnosis not present

## 2024-04-10 DIAGNOSIS — M62562 Muscle wasting and atrophy, not elsewhere classified, left lower leg: Secondary | ICD-10-CM | POA: Diagnosis not present

## 2024-04-10 DIAGNOSIS — Z741 Need for assistance with personal care: Secondary | ICD-10-CM | POA: Diagnosis not present

## 2024-04-10 DIAGNOSIS — R2689 Other abnormalities of gait and mobility: Secondary | ICD-10-CM | POA: Diagnosis not present

## 2024-04-11 DIAGNOSIS — M62561 Muscle wasting and atrophy, not elsewhere classified, right lower leg: Secondary | ICD-10-CM | POA: Diagnosis not present

## 2024-04-11 DIAGNOSIS — R2689 Other abnormalities of gait and mobility: Secondary | ICD-10-CM | POA: Diagnosis not present

## 2024-04-11 DIAGNOSIS — M6281 Muscle weakness (generalized): Secondary | ICD-10-CM | POA: Diagnosis not present

## 2024-04-11 DIAGNOSIS — M62562 Muscle wasting and atrophy, not elsewhere classified, left lower leg: Secondary | ICD-10-CM | POA: Diagnosis not present

## 2024-04-11 DIAGNOSIS — Z741 Need for assistance with personal care: Secondary | ICD-10-CM | POA: Diagnosis not present

## 2024-04-15 DIAGNOSIS — Z741 Need for assistance with personal care: Secondary | ICD-10-CM | POA: Diagnosis not present

## 2024-04-15 DIAGNOSIS — R2689 Other abnormalities of gait and mobility: Secondary | ICD-10-CM | POA: Diagnosis not present

## 2024-04-15 DIAGNOSIS — M62562 Muscle wasting and atrophy, not elsewhere classified, left lower leg: Secondary | ICD-10-CM | POA: Diagnosis not present

## 2024-04-15 DIAGNOSIS — M62561 Muscle wasting and atrophy, not elsewhere classified, right lower leg: Secondary | ICD-10-CM | POA: Diagnosis not present

## 2024-04-15 DIAGNOSIS — M6281 Muscle weakness (generalized): Secondary | ICD-10-CM | POA: Diagnosis not present

## 2024-04-16 DIAGNOSIS — M62562 Muscle wasting and atrophy, not elsewhere classified, left lower leg: Secondary | ICD-10-CM | POA: Diagnosis not present

## 2024-04-16 DIAGNOSIS — Z741 Need for assistance with personal care: Secondary | ICD-10-CM | POA: Diagnosis not present

## 2024-04-16 DIAGNOSIS — R2689 Other abnormalities of gait and mobility: Secondary | ICD-10-CM | POA: Diagnosis not present

## 2024-04-16 DIAGNOSIS — M62561 Muscle wasting and atrophy, not elsewhere classified, right lower leg: Secondary | ICD-10-CM | POA: Diagnosis not present

## 2024-04-16 DIAGNOSIS — M6281 Muscle weakness (generalized): Secondary | ICD-10-CM | POA: Diagnosis not present

## 2024-04-17 DIAGNOSIS — Z741 Need for assistance with personal care: Secondary | ICD-10-CM | POA: Diagnosis not present

## 2024-04-17 DIAGNOSIS — M62562 Muscle wasting and atrophy, not elsewhere classified, left lower leg: Secondary | ICD-10-CM | POA: Diagnosis not present

## 2024-04-17 DIAGNOSIS — M6281 Muscle weakness (generalized): Secondary | ICD-10-CM | POA: Diagnosis not present

## 2024-04-17 DIAGNOSIS — M62561 Muscle wasting and atrophy, not elsewhere classified, right lower leg: Secondary | ICD-10-CM | POA: Diagnosis not present

## 2024-04-17 DIAGNOSIS — R2689 Other abnormalities of gait and mobility: Secondary | ICD-10-CM | POA: Diagnosis not present

## 2024-04-18 DIAGNOSIS — M6281 Muscle weakness (generalized): Secondary | ICD-10-CM | POA: Diagnosis not present

## 2024-04-18 DIAGNOSIS — R2689 Other abnormalities of gait and mobility: Secondary | ICD-10-CM | POA: Diagnosis not present

## 2024-04-18 DIAGNOSIS — M62562 Muscle wasting and atrophy, not elsewhere classified, left lower leg: Secondary | ICD-10-CM | POA: Diagnosis not present

## 2024-04-18 DIAGNOSIS — M62561 Muscle wasting and atrophy, not elsewhere classified, right lower leg: Secondary | ICD-10-CM | POA: Diagnosis not present

## 2024-04-18 DIAGNOSIS — Z741 Need for assistance with personal care: Secondary | ICD-10-CM | POA: Diagnosis not present

## 2024-04-20 DIAGNOSIS — Z741 Need for assistance with personal care: Secondary | ICD-10-CM | POA: Diagnosis not present

## 2024-04-20 DIAGNOSIS — M62561 Muscle wasting and atrophy, not elsewhere classified, right lower leg: Secondary | ICD-10-CM | POA: Diagnosis not present

## 2024-04-20 DIAGNOSIS — R2689 Other abnormalities of gait and mobility: Secondary | ICD-10-CM | POA: Diagnosis not present

## 2024-04-20 DIAGNOSIS — M62562 Muscle wasting and atrophy, not elsewhere classified, left lower leg: Secondary | ICD-10-CM | POA: Diagnosis not present

## 2024-04-20 DIAGNOSIS — M6281 Muscle weakness (generalized): Secondary | ICD-10-CM | POA: Diagnosis not present

## 2024-04-22 DIAGNOSIS — M62562 Muscle wasting and atrophy, not elsewhere classified, left lower leg: Secondary | ICD-10-CM | POA: Diagnosis not present

## 2024-04-22 DIAGNOSIS — M6281 Muscle weakness (generalized): Secondary | ICD-10-CM | POA: Diagnosis not present

## 2024-04-22 DIAGNOSIS — R2689 Other abnormalities of gait and mobility: Secondary | ICD-10-CM | POA: Diagnosis not present

## 2024-04-22 DIAGNOSIS — M62561 Muscle wasting and atrophy, not elsewhere classified, right lower leg: Secondary | ICD-10-CM | POA: Diagnosis not present

## 2024-04-22 DIAGNOSIS — Z741 Need for assistance with personal care: Secondary | ICD-10-CM | POA: Diagnosis not present

## 2024-04-23 DIAGNOSIS — M6281 Muscle weakness (generalized): Secondary | ICD-10-CM | POA: Diagnosis not present

## 2024-04-23 DIAGNOSIS — Z741 Need for assistance with personal care: Secondary | ICD-10-CM | POA: Diagnosis not present

## 2024-04-23 DIAGNOSIS — M62562 Muscle wasting and atrophy, not elsewhere classified, left lower leg: Secondary | ICD-10-CM | POA: Diagnosis not present

## 2024-04-23 DIAGNOSIS — M62561 Muscle wasting and atrophy, not elsewhere classified, right lower leg: Secondary | ICD-10-CM | POA: Diagnosis not present

## 2024-04-23 DIAGNOSIS — R2689 Other abnormalities of gait and mobility: Secondary | ICD-10-CM | POA: Diagnosis not present

## 2024-04-24 DIAGNOSIS — M62562 Muscle wasting and atrophy, not elsewhere classified, left lower leg: Secondary | ICD-10-CM | POA: Diagnosis not present

## 2024-04-24 DIAGNOSIS — M6281 Muscle weakness (generalized): Secondary | ICD-10-CM | POA: Diagnosis not present

## 2024-04-24 DIAGNOSIS — M62561 Muscle wasting and atrophy, not elsewhere classified, right lower leg: Secondary | ICD-10-CM | POA: Diagnosis not present

## 2024-04-24 DIAGNOSIS — I10 Essential (primary) hypertension: Secondary | ICD-10-CM | POA: Diagnosis not present

## 2024-04-24 DIAGNOSIS — E7849 Other hyperlipidemia: Secondary | ICD-10-CM | POA: Diagnosis not present

## 2024-04-24 DIAGNOSIS — E1169 Type 2 diabetes mellitus with other specified complication: Secondary | ICD-10-CM | POA: Diagnosis not present

## 2024-04-24 DIAGNOSIS — R2689 Other abnormalities of gait and mobility: Secondary | ICD-10-CM | POA: Diagnosis not present

## 2024-04-24 DIAGNOSIS — Z741 Need for assistance with personal care: Secondary | ICD-10-CM | POA: Diagnosis not present

## 2024-04-25 DIAGNOSIS — Z741 Need for assistance with personal care: Secondary | ICD-10-CM | POA: Diagnosis not present

## 2024-04-25 DIAGNOSIS — R2689 Other abnormalities of gait and mobility: Secondary | ICD-10-CM | POA: Diagnosis not present

## 2024-04-25 DIAGNOSIS — M62561 Muscle wasting and atrophy, not elsewhere classified, right lower leg: Secondary | ICD-10-CM | POA: Diagnosis not present

## 2024-04-25 DIAGNOSIS — M6281 Muscle weakness (generalized): Secondary | ICD-10-CM | POA: Diagnosis not present

## 2024-04-25 DIAGNOSIS — M62562 Muscle wasting and atrophy, not elsewhere classified, left lower leg: Secondary | ICD-10-CM | POA: Diagnosis not present

## 2024-04-26 DIAGNOSIS — R2689 Other abnormalities of gait and mobility: Secondary | ICD-10-CM | POA: Diagnosis not present

## 2024-04-26 DIAGNOSIS — M6281 Muscle weakness (generalized): Secondary | ICD-10-CM | POA: Diagnosis not present

## 2024-04-26 DIAGNOSIS — Z741 Need for assistance with personal care: Secondary | ICD-10-CM | POA: Diagnosis not present

## 2024-04-26 DIAGNOSIS — M62561 Muscle wasting and atrophy, not elsewhere classified, right lower leg: Secondary | ICD-10-CM | POA: Diagnosis not present

## 2024-04-26 DIAGNOSIS — M62562 Muscle wasting and atrophy, not elsewhere classified, left lower leg: Secondary | ICD-10-CM | POA: Diagnosis not present

## 2024-05-21 DIAGNOSIS — E1129 Type 2 diabetes mellitus with other diabetic kidney complication: Secondary | ICD-10-CM | POA: Diagnosis not present

## 2024-05-26 DIAGNOSIS — E1169 Type 2 diabetes mellitus with other specified complication: Secondary | ICD-10-CM | POA: Diagnosis not present

## 2024-05-26 DIAGNOSIS — E7849 Other hyperlipidemia: Secondary | ICD-10-CM | POA: Diagnosis not present

## 2024-05-26 DIAGNOSIS — I10 Essential (primary) hypertension: Secondary | ICD-10-CM | POA: Diagnosis not present

## 2024-06-23 DIAGNOSIS — I10 Essential (primary) hypertension: Secondary | ICD-10-CM | POA: Diagnosis not present

## 2024-06-23 DIAGNOSIS — E7849 Other hyperlipidemia: Secondary | ICD-10-CM | POA: Diagnosis not present

## 2024-06-23 DIAGNOSIS — E1169 Type 2 diabetes mellitus with other specified complication: Secondary | ICD-10-CM | POA: Diagnosis not present

## 2024-07-23 DIAGNOSIS — E1169 Type 2 diabetes mellitus with other specified complication: Secondary | ICD-10-CM | POA: Diagnosis not present

## 2024-07-23 DIAGNOSIS — E7849 Other hyperlipidemia: Secondary | ICD-10-CM | POA: Diagnosis not present

## 2024-07-23 DIAGNOSIS — I10 Essential (primary) hypertension: Secondary | ICD-10-CM | POA: Diagnosis not present
